# Patient Record
Sex: Male | Born: 1955
Health system: Southern US, Community
[De-identification: ages and names within clinical notes are randomized; demographics above are authoritative.]

## PROBLEM LIST (undated history)

## (undated) DIAGNOSIS — M25462 Effusion, left knee: Secondary | ICD-10-CM

## (undated) DIAGNOSIS — R7303 Prediabetes: Secondary | ICD-10-CM

## (undated) DIAGNOSIS — R1310 Dysphagia, unspecified: Secondary | ICD-10-CM

## (undated) DIAGNOSIS — J189 Pneumonia, unspecified organism: Secondary | ICD-10-CM

## (undated) DIAGNOSIS — D649 Anemia, unspecified: Secondary | ICD-10-CM

## (undated) DIAGNOSIS — J029 Acute pharyngitis, unspecified: Secondary | ICD-10-CM

## (undated) DIAGNOSIS — Z8719 Personal history of other diseases of the digestive system: Secondary | ICD-10-CM

## (undated) DIAGNOSIS — E669 Obesity, unspecified: Secondary | ICD-10-CM

## (undated) DIAGNOSIS — E291 Testicular hypofunction: Secondary | ICD-10-CM

## (undated) DIAGNOSIS — T4145XA Adverse effect of unspecified anesthetic, initial encounter: Secondary | ICD-10-CM

## (undated) DIAGNOSIS — E7800 Pure hypercholesterolemia, unspecified: Secondary | ICD-10-CM

## (undated) DIAGNOSIS — R509 Fever, unspecified: Secondary | ICD-10-CM

## (undated) DIAGNOSIS — N2889 Other specified disorders of kidney and ureter: Secondary | ICD-10-CM

## (undated) DIAGNOSIS — K8020 Calculus of gallbladder without cholecystitis without obstruction: Secondary | ICD-10-CM

## (undated) DIAGNOSIS — M1990 Unspecified osteoarthritis, unspecified site: Secondary | ICD-10-CM

## (undated) DIAGNOSIS — N529 Male erectile dysfunction, unspecified: Secondary | ICD-10-CM

## (undated) DIAGNOSIS — T8859XA Other complications of anesthesia, initial encounter: Secondary | ICD-10-CM

## (undated) DIAGNOSIS — Z8673 Personal history of transient ischemic attack (TIA), and cerebral infarction without residual deficits: Secondary | ICD-10-CM

## (undated) DIAGNOSIS — Z8709 Personal history of other diseases of the respiratory system: Secondary | ICD-10-CM

## (undated) DIAGNOSIS — K219 Gastro-esophageal reflux disease without esophagitis: Secondary | ICD-10-CM

## (undated) DIAGNOSIS — E119 Type 2 diabetes mellitus without complications: Secondary | ICD-10-CM

## (undated) DIAGNOSIS — R112 Nausea with vomiting, unspecified: Secondary | ICD-10-CM

## (undated) DIAGNOSIS — G2581 Restless legs syndrome: Secondary | ICD-10-CM

## (undated) DIAGNOSIS — Z9889 Other specified postprocedural states: Secondary | ICD-10-CM

## (undated) HISTORY — PX: OTHER SURGICAL HISTORY: SHX169

## (undated) HISTORY — PX: KNEE ARTHROSCOPY: SUR90

## (undated) HISTORY — PX: NASAL SEPTUM SURGERY: SHX37

---

## 1999-03-24 ENCOUNTER — Encounter: Payer: Self-pay | Admitting: Internal Medicine

## 1999-03-24 ENCOUNTER — Encounter: Admission: RE | Admit: 1999-03-24 | Discharge: 1999-03-24 | Payer: Self-pay | Admitting: Internal Medicine

## 2001-04-14 ENCOUNTER — Encounter: Admission: RE | Admit: 2001-04-14 | Discharge: 2001-06-02 | Payer: Self-pay | Admitting: Internal Medicine

## 2001-04-22 ENCOUNTER — Ambulatory Visit (HOSPITAL_COMMUNITY): Admission: RE | Admit: 2001-04-22 | Discharge: 2001-04-22 | Payer: Self-pay | Admitting: Specialist

## 2001-04-22 ENCOUNTER — Encounter: Payer: Self-pay | Admitting: Specialist

## 2001-05-20 ENCOUNTER — Ambulatory Visit (HOSPITAL_COMMUNITY): Admission: RE | Admit: 2001-05-20 | Discharge: 2001-05-20 | Payer: Self-pay | Admitting: *Deleted

## 2002-04-23 ENCOUNTER — Encounter: Payer: Self-pay | Admitting: Endocrinology

## 2002-04-23 ENCOUNTER — Ambulatory Visit (HOSPITAL_COMMUNITY): Admission: RE | Admit: 2002-04-23 | Discharge: 2002-04-23 | Payer: Self-pay | Admitting: Endocrinology

## 2005-11-24 ENCOUNTER — Ambulatory Visit (HOSPITAL_COMMUNITY): Admission: RE | Admit: 2005-11-24 | Discharge: 2005-11-24 | Payer: Self-pay | Admitting: *Deleted

## 2012-12-19 ENCOUNTER — Ambulatory Visit: Payer: 59 | Admitting: Emergency Medicine

## 2012-12-19 VITALS — BP 160/90 | HR 86 | Temp 99.3°F | Resp 16 | Ht 68.0 in | Wt 212.0 lb

## 2012-12-19 DIAGNOSIS — J018 Other acute sinusitis: Secondary | ICD-10-CM

## 2012-12-19 MED ORDER — AMOXICILLIN-POT CLAVULANATE 875-125 MG PO TABS: 1.0000 | ORAL_TABLET | Freq: Two times a day (BID) | ORAL | Status: DC

## 2012-12-19 NOTE — Patient Instructions (Addendum)
Hypertension As your heart beats, it forces blood through your arteries. This force is your blood pressure. If the pressure is too high, it is called hypertension (HTN) or high blood pressure. HTN is dangerous because you may have it and not know it. High blood pressure may mean that your heart has to work harder to pump blood. Your arteries may be narrow or stiff. The extra work puts you at risk for heart disease, stroke, and other problems.  Blood pressure consists of two numbers, a higher number over a lower, 110/72, for example. It is stated as "110 over 72." The ideal is below 120 for the top number (systolic) and under 80 for the bottom (diastolic). Write down your blood pressure today. You should pay close attention to your blood pressure if you have certain conditions such as:  Heart failure.  Prior heart attack.  Diabetes  Chronic kidney disease.  Prior stroke.  Multiple risk factors for heart disease. To see if you have HTN, your blood pressure should be measured while you are seated with your arm held at the level of the heart. It should be measured at least twice. A one-time elevated blood pressure reading (especially in the Emergency Department) does not mean that you need treatment. There may be conditions in which the blood pressure is different between your right and left arms. It is important to see your caregiver soon for a recheck. Most people have essential hypertension which means that there is not a specific cause. This type of high blood pressure may be lowered by changing lifestyle factors such as:  Stress.  Smoking.  Lack of exercise.  Excessive weight.  Drug/tobacco/alcohol use.  Eating less salt. Most people do not have symptoms from high blood pressure until it has caused damage to the body. Effective treatment can often prevent, delay or reduce that damage. TREATMENT  When a cause has been identified, treatment for high blood pressure is directed at the  cause. There are a large number of medications to treat HTN. These fall into several categories, and your caregiver will help you select the medicines that are best for you. Medications may have side effects. You should review side effects with your caregiver. If your blood pressure stays high after you have made lifestyle changes or started on medicines,   Your medication(s) may need to be changed.  Other problems may need to be addressed.  Be certain you understand your prescriptions, and know how and when to take your medicine.  Be sure to follow up with your caregiver within the time frame advised (usually within two weeks) to have your blood pressure rechecked and to review your medications.  If you are taking more than one medicine to lower your blood pressure, make sure you know how and at what times they should be taken. Taking two medicines at the same time can result in blood pressure that is too low. SEEK IMMEDIATE MEDICAL CARE IF:  You develop a severe headache, blurred or changing vision, or confusion.  You have unusual weakness or numbness, or a faint feeling.  You have severe chest or abdominal pain, vomiting, or breathing problems. MAKE SURE YOU:   Understand these instructions.  Will watch your condition.  Will get help right away if you are not doing well or get worse. Document Released: 01/12/2005 Document Revised: 04/06/2011 Document Reviewed: 09/02/2007 ExitCare Patient Information 2014 [employer]. Sinusitis Sinusitis is redness, soreness, and swelling (inflammation) of the paranasal sinuses. Paranasal sinuses are air   pockets within the bones of your face (beneath the eyes, the middle of the forehead, or above the eyes). In healthy paranasal sinuses, mucus is able to drain out, and air is able to circulate through them by way of your nose. However, when your paranasal sinuses are inflamed, mucus and air can become trapped. This can allow bacteria and other germs  to grow and cause infection. Sinusitis can develop quickly and last only a short time (acute) or continue over a long period (chronic). Sinusitis that lasts for more than 12 weeks is considered chronic.  CAUSES  Causes of sinusitis include:  Allergies.  Structural abnormalities, such as displacement of the cartilage that separates your nostrils (deviated septum), which can decrease the air flow through your nose and sinuses and affect sinus drainage.  Functional abnormalities, such as when the small hairs (cilia) that line your sinuses and help remove mucus do not work properly or are not present. SYMPTOMS  Symptoms of acute and chronic sinusitis are the same. The primary symptoms are pain and pressure around the affected sinuses. Other symptoms include:  Upper toothache.  Earache.  Headache.  Bad breath.  Decreased sense of smell and taste.  A cough, which worsens when you are lying flat.  Fatigue.  Fever.  Thick drainage from your nose, which often is green and may contain pus (purulent).  Swelling and warmth over the affected sinuses. DIAGNOSIS  Your caregiver will perform a physical exam. During the exam, your caregiver may:  Look in your nose for signs of abnormal growths in your nostrils (nasal polyps).  Tap over the affected sinus to check for signs of infection.  View the inside of your sinuses (endoscopy) with a special imaging device with a light attached (endoscope), which is inserted into your sinuses. If your caregiver suspects that you have chronic sinusitis, one or more of the following tests may be recommended:  Allergy tests.  Nasal culture A sample of mucus is taken from your nose and sent to a lab and screened for bacteria.  Nasal cytology A sample of mucus is taken from your nose and examined by your caregiver to determine if your sinusitis is related to an allergy. TREATMENT  Most cases of acute sinusitis are related to a viral infection and will  resolve on their own within 10 days. Sometimes medicines are prescribed to help relieve symptoms (pain medicine, decongestants, nasal steroid sprays, or saline sprays).  However, for sinusitis related to a bacterial infection, your caregiver will prescribe antibiotic medicines. These are medicines that will help kill the bacteria causing the infection.  Rarely, sinusitis is caused by a fungal infection. In theses cases, your caregiver will prescribe antifungal medicine. For some cases of chronic sinusitis, surgery is needed. Generally, these are cases in which sinusitis recurs more than 3 times per year, despite other treatments. HOME CARE INSTRUCTIONS   Drink plenty of water. Water helps thin the mucus so your sinuses can drain more easily.  Use a humidifier.  Inhale steam 3 to 4 times a day (for example, sit in the bathroom with the shower running).  Apply a warm, moist washcloth to your face 3 to 4 times a day, or as directed by your caregiver.  Use saline nasal sprays to help moisten and clean your sinuses.  Take over-the-counter or prescription medicines for pain, discomfort, or fever only as directed by your caregiver. SEEK IMMEDIATE MEDICAL CARE IF:  You have increasing pain or severe headaches.  You have nausea, vomiting,   or drowsiness.  You have swelling around your face.  You have vision problems.  You have a stiff neck.  You have difficulty breathing. MAKE SURE YOU:   Understand these instructions.  Will watch your condition.  Will get help right away if you are not doing well or get worse. Document Released: 01/12/2005 Document Revised: 04/06/2011 Document Reviewed: 01/27/2011 ExitCare Patient Information 2014 [employer].  

## 2012-12-19 NOTE — Progress Notes (Signed)
[facility] [address] [place] [address] [phone]  Date:  12/19/2012   Name:  Lance Scott   DOB:  1955-12-29   MRN:  [id number]  PCP:  No primary provider on file.    Chief Complaint: Sore Throat   History of Present Illness:  Lance Scott is a 57 y.o. very pleasant male patient who presents with the following:  Ill with nasal congestion and post nasal drip.  Green discharge.  No fever or chills.  Occasional non productive cough. No wheezing or shortness of breath.  No nausea or vomiting. No stool change or rash  Some sore throat.  No improvement with over the counter medications or other home remedies. Denies other complaint or health concern today.   There are no active problems to display for this patient.   No past medical history on file.  No past surgical history on file.  History  Substance Use Topics  . Smoking status: Never Smoker   . Smokeless tobacco: Not on file  . Alcohol Use: Not on file    No family history on file.  No Known Allergies  Medication list has been reviewed and updated.  No current outpatient prescriptions on file prior to visit.   No current facility-administered medications on file prior to visit.    Review of Systems:  As per HPI, otherwise negative.    Physical Examination: Filed Vitals:   12/19/12 1504  BP: 160/90  Pulse: 86  Temp: 99.3 F (37.4 C)  Resp: 16   Filed Vitals:   12/19/12 1504  Height: 5\' 8"  (1.727 m)  Weight: 212 lb (96.163 kg)   Body mass index is 32.24 kg/(m^2). Ideal Body Weight: Weight in (lb) to have BMI = 25: 164.1  GEN: WDWN, NAD, Non-toxic, A & O x 3 HEENT: Atraumatic, Normocephalic. Neck supple. No masses, No LAD. Ears and Nose: No external deformity. CV: RRR, No M/G/R. No JVD. No thrill. No extra heart sounds. PULM: CTA B, no wheezes, crackles, rhonchi. No retractions. No resp. distress. No accessory muscle use. ABD: S, NT, ND, +BS. No rebound. No  HSM. EXTR: No c/c/e NEURO Normal gait.  PSYCH: Normally interactive. Conversant. Not depressed or anxious appearing.  Calm demeanor.    Assessment and Plan: Sinusitis augmentin Elevated BP Recheck BP    Signed,  Phillips Odor, MD

## 2013-05-10 ENCOUNTER — Ambulatory Visit: Payer: Self-pay | Admitting: Family Medicine

## 2013-05-10 VITALS — BP 146/90 | HR 55 | Temp 98.1°F | Resp 16 | Ht 68.0 in | Wt 216.0 lb

## 2013-05-10 DIAGNOSIS — Z0289 Encounter for other administrative examinations: Secondary | ICD-10-CM

## 2013-05-10 DIAGNOSIS — Z0000 Encounter for general adult medical examination without abnormal findings: Secondary | ICD-10-CM

## 2013-05-10 NOTE — Progress Notes (Signed)
[facility] [address] [place] [address] [phone]  Date:  05/10/2013   Name:  Lance Scott   DOB:  [birth year]   MRN:  [id number]  PCP:  No primary provider on file.    Chief Complaint: Employment Physical   History of Present Illness:  Lance Scott is a 58 y.o. very pleasant male patient who presents with the following:  He is here today for a DOT exam.  No prior history of HTN He is generally very healthy.   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   No past medical history on file.  No past surgical history on file.  History  Substance Use Topics  . Smoking status: Never Smoker   . Smokeless tobacco: Not on file  . Alcohol Use: Not on file    No family history on file.  No Known Allergies  Medication list has been reviewed and updated.  Current Outpatient Prescriptions on File Prior to Visit  Medication Sig Dispense Refill  . pantoprazole (PROTONIX) 20 MG tablet Take 20 mg by mouth daily.      Marland Kitchen amoxicillin-clavulanate (AUGMENTIN) 875-125 MG per tablet Take 1 tablet by mouth 2 (two) times daily.  20 tablet  0   No current facility-administered medications on file prior to visit.    Review of Systems:  As per HPI- otherwise negative.   Physical Examination: Filed Vitals:   05/10/13 1222  BP: 168/82  Pulse: 55  Temp: 98.1 F (36.7 C)  Resp: 16   Filed Vitals:   05/10/13 1222  Height: 5\' 8"  (1.727 m)  Weight: 216 lb (97.977 kg)   Body mass index is 32.85 kg/(m^2). Ideal Body Weight: Weight in (lb) to have BMI = 25: 164.1  GEN: WDWN, NAD, Non-toxic, A & O x 3, looks well HEENT: Atraumatic, Normocephalic. Neck supple. No masses, No LAD.  Bilateral TM wnl, oropharynx normal.  PEERL,EOMI.   Ears and Nose: No external deformity. CV: RRR, No M/G/R. No JVD. No thrill. No extra heart sounds. PULM: CTA B, no wheezes, crackles, rhonchi. No  retractions. No resp. distress. No accessory muscle use. ABD: S, NT, ND. No rebound. No HSM. EXTR: No c/c/e NEURO Normal gait. Normal strength, sensation and DTR all extremities PSYCH: Normally interactive. Conversant. Not depressed or anxious appearing.  Calm demeanor.  Gu: no inguinal hernia . 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

## 2014-08-16 ENCOUNTER — Ambulatory Visit: Payer: Self-pay | Admitting: Orthopedic Surgery

## 2014-08-16 NOTE — Progress Notes (Signed)
Preoperative surgical orders have been place into the Epic hospital system for Lance Scott on 08/16/2014, 6:24 PM  by Mickel Crow for surgery on 09/10/14.  Preop Total Knee orders including Experal, IV Tylenol, and IV Decadron as long as there are no contraindications to the above medications. [name], PA-C

## 2014-09-03 ENCOUNTER — Encounter (HOSPITAL_COMMUNITY): Payer: Self-pay

## 2014-09-03 ENCOUNTER — Other Ambulatory Visit (HOSPITAL_COMMUNITY): Payer: Self-pay | Admitting: *Deleted

## 2014-09-03 ENCOUNTER — Encounter (HOSPITAL_COMMUNITY)
Admission: RE | Admit: 2014-09-03 | Discharge: 2014-09-03 | Disposition: A | Discharge status: Home / Self Care | Payer: 59 | Source: Ambulatory Visit | Attending: Orthopedic Surgery | Admitting: Orthopedic Surgery

## 2014-09-03 DIAGNOSIS — M179 Osteoarthritis of knee, unspecified: Secondary | ICD-10-CM | POA: Diagnosis not present

## 2014-09-03 DIAGNOSIS — Z01818 Encounter for other preprocedural examination: Secondary | ICD-10-CM | POA: Insufficient documentation

## 2014-09-03 HISTORY — DX: Other complications of anesthesia, initial encounter: T88.59XA

## 2014-09-03 HISTORY — DX: Nausea with vomiting, unspecified: R11.2

## 2014-09-03 HISTORY — DX: Adverse effect of unspecified anesthetic, initial encounter: T41.45XA

## 2014-09-03 HISTORY — DX: Gastro-esophageal reflux disease without esophagitis: K21.9

## 2014-09-03 HISTORY — DX: Unspecified osteoarthritis, unspecified site: M19.90

## 2014-09-03 HISTORY — DX: Other specified postprocedural states: Z98.890

## 2014-09-03 LAB — COMPREHENSIVE METABOLIC PANEL
ALBUMIN: 4.2 g/dL (ref 3.5–5.0)
ALK PHOS: 62 U/L (ref 38–126)
ALT: 14 U/L — ABNORMAL LOW (ref 17–63)
AST: 18 U/L (ref 15–41)
Anion gap: 6 (ref 5–15)
BUN: 12 mg/dL (ref 6–20)
CALCIUM: 9.3 mg/dL (ref 8.9–10.3)
CO2: 29 mmol/L (ref 22–32)
Chloride: 103 mmol/L (ref 101–111)
Creatinine, Ser: 1.07 mg/dL (ref 0.61–1.24)
GFR calc Af Amer: >60 mL/min (ref >60)
GFR calc non Af Amer: >60 mL/min (ref >60)
Glucose, Bld: 102 mg/dL — ABNORMAL HIGH (ref 65–99)
POTASSIUM: 4.5 mmol/L (ref 3.5–5.1)
Sodium: 138 mmol/L (ref 135–145)
Total Bilirubin: 1.1 mg/dL (ref 0.3–1.2)
Total Protein: 7.2 g/dL (ref 6.5–8.1)

## 2014-09-03 LAB — URINALYSIS, ROUTINE W REFLEX MICROSCOPIC
Bilirubin Urine: NEGATIVE
Glucose, UA: NEGATIVE mg/dL
Hgb urine dipstick: NEGATIVE
Ketones, ur: NEGATIVE mg/dL
Leukocytes, UA: NEGATIVE
Nitrite: NEGATIVE
Protein, ur: NEGATIVE mg/dL
Specific Gravity, Urine: 1.014 (ref 1.005–1.030)
Urobilinogen, UA: 0.2 mg/dL (ref 0.0–1.0)
pH: 6.5 (ref 5.0–8.0)

## 2014-09-03 LAB — CBC
HCT: 41.2 % (ref 39.0–52.0)
Hemoglobin: 13.4 g/dL (ref 13.0–17.0)
MCH: 27.6 pg (ref 26.0–34.0)
MCHC: 32.5 g/dL (ref 30.0–36.0)
MCV: 84.9 fL (ref 78.0–100.0)
PLATELETS: 302 10*3/uL (ref 150–400)
RBC: 4.85 MIL/uL (ref 4.22–5.81)
RDW: 13.7 % (ref 11.5–15.5)
WBC: 7.2 10*3/uL (ref 4.0–10.5)

## 2014-09-03 LAB — SURGICAL PCR SCREEN
MRSA, PCR: NEGATIVE
Staphylococcus aureus: POSITIVE — AB

## 2014-09-03 LAB — PROTIME-INR
INR: 0.99 (ref 0.00–1.49)
Prothrombin Time: 13.3 seconds (ref 11.6–15.2)

## 2014-09-03 LAB — ABO/RH: ABO/RH(D): O POS

## 2014-09-03 LAB — APTT: APTT: 35 s (ref 24–37)

## 2014-09-03 NOTE — Patient Instructions (Addendum)
YOUR PROCEDURE IS SCHEDULED ON :  09/10/14  REPORT TO [facility] ENTRANCE FOLLOW SIGNS TO EAST ELEVATOR - GO TO 3rd FLOOR CHECK IN AT 3 EAST NURSES STATION (SHORT STAY) AT:  5:15 AM  CALL THIS NUMBER IF YOU HAVE PROBLEMS THE MORNING OF SURGERY [phone]  REMEMBER:ONLY 1 PER PERSON MAY GO TO SHORT STAY WITH YOU TO GET READY THE MORNING OF YOUR SURGERY  DO NOT EAT FOOD OR DRINK LIQUIDS AFTER MIDNIGHT  TAKE THESE MEDICINES THE MORNING OF SURGERY:NONE  YOU MAY NOT HAVE ANY METAL ON YOUR BODY INCLUDING HAIR PINS AND PIERCING'S. DO NOT WEAR JEWELRY, MAKEUP, LOTIONS, POWDERS OR PERFUMES. DO NOT WEAR NAIL POLISH. DO NOT SHAVE 48 HRS PRIOR TO SURGERY. MEN MAY SHAVE FACE AND NECK.  DO NOT [place]. [facility] IS NOT RESPONSIBLE FOR VALUABLES.  CONTACTS, DENTURES OR PARTIALS MAY NOT BE WORN TO SURGERY. LEAVE SUITCASE IN CAR. CAN BE BROUGHT TO ROOM AFTER SURGERY.  PATIENTS DISCHARGED THE DAY OF SURGERY WILL NOT BE ALLOWED TO DRIVE HOME.  PLEASE READ OVER THE FOLLOWING INSTRUCTION SHEETS _________________________________________________________________________________                                          [facility] - PREPARING FOR SURGERY  Before surgery, you can play an important role.  Because skin is not sterile, your skin needs to be as free of germs as possible.  You can reduce the number of germs on your skin by washing with CHG (chlorahexidine gluconate) soap before surgery.  CHG is an antiseptic cleaner which kills germs and bonds with the skin to continue killing germs even after washing. Please DO NOT use if you have an allergy to CHG or antibacterial soaps.  If your skin becomes reddened/irritated stop using the CHG and inform your nurse when you arrive at Short Stay. Do not shave (including legs and underarms) for at least 48 hours prior to the first CHG shower.  You may shave your face. Please follow these instructions  carefully:   1.  Shower with CHG Soap the night before surgery and the  morning of Surgery.   2.  If you choose to wash your hair, wash your hair first as usual with your  normal  Shampoo.   3.  After you shampoo, rinse your hair and body thoroughly to remove the  shampoo.                                         4.  Use CHG as you would any other liquid soap.  You can apply chg directly  to the skin and wash . Gently wash with scrungie or clean wascloth    5.  Apply the CHG Soap to your body ONLY FROM THE NECK DOWN.   Do not use on open                           Wound or open sores. Avoid contact with eyes, ears mouth and genitals (private parts).                        Genitals (private parts) with your normal soap.  6.  Wash thoroughly, paying special attention to the area where your surgery  will be performed.   7.  Thoroughly rinse your body with warm water from the neck down.   8.  DO NOT shower/wash with your normal soap after using and rinsing off  the CHG Soap .                9.  Pat yourself dry with a clean towel.             10.  Wear clean night clothes to bed after shower             11.  Place clean sheets on your bed the night of your first shower and do not  sleep with pets.  Day of Surgery : Do not apply any lotions/deodorants the morning of surgery.  Please wear clean clothes to the hospital/surgery center.  FAILURE TO FOLLOW THESE INSTRUCTIONS MAY RESULT IN THE CANCELLATION OF YOUR SURGERY    PATIENT SIGNATURE_________________________________  ______________________________________________________________________     Adam Phenix  An incentive spirometer is a tool that can help keep your lungs clear and active. This tool measures how well you are filling your lungs with each breath. Taking long deep breaths may help reverse or decrease the chance of developing breathing (pulmonary) problems (especially infection) following:  A long  period of time when you are unable to move or be active. BEFORE THE PROCEDURE   If the spirometer includes an indicator to show your best effort, your nurse or respiratory therapist will set it to a desired goal.  If possible, sit up straight or lean slightly forward. Try not to slouch.  Hold the incentive spirometer in an upright position. INSTRUCTIONS FOR USE   Sit on the edge of your bed if possible, or sit up as far as you can in bed or on a chair.  Hold the incentive spirometer in an upright position.  Breathe out normally.  Place the mouthpiece in your mouth and seal your lips tightly around it.  Breathe in slowly and as deeply as possible, raising the piston or the ball toward the top of the column.  Hold your breath for 3-5 seconds or for as long as possible. Allow the piston or ball to fall to the bottom of the column.  Remove the mouthpiece from your mouth and breathe out normally.  Rest for a few seconds and repeat Steps 1 through 7 at least 10 times every 1-2 hours when you are awake. Take your time and take a few normal breaths between deep breaths.  The spirometer may include an indicator to show your best effort. Use the indicator as a goal to work toward during each repetition.  After each set of 10 deep breaths, practice coughing to be sure your lungs are clear. If you have an incision (the cut made at the time of surgery), support your incision when coughing by placing a pillow or rolled up towels firmly against it. Once you are able to get out of bed, walk around indoors and cough well. You may stop using the incentive spirometer when instructed by your caregiver.  RISKS AND COMPLICATIONS  Take your time so you do not get dizzy or light-headed.  If you are in pain, you may need to take or ask for pain medication before doing incentive spirometry. It is harder to take a deep breath if you are having pain. AFTER USE  Rest and breathe slowly and easily.  It can  be helpful to keep track of a log of your progress. Your caregiver can provide you with a simple table to help with this. If you are using the spirometer at home, follow these instructions: [place] IF:   You are having difficultly using the spirometer.  You have trouble using the spirometer as often as instructed.  Your pain medication is not giving enough relief while using the spirometer.  You develop fever of 100.5 F (38.1 C) or higher. SEEK IMMEDIATE MEDICAL CARE IF:   You cough up bloody sputum that had not been present before.  You develop fever of 102 F (38.9 C) or greater.  You develop worsening pain at or near the incision site. MAKE SURE YOU:   Understand these instructions.  Will watch your condition.  Will get help right away if you are not doing well or get worse. Document Released: 05/25/2006 Document Revised: 04/06/2011 Document Reviewed: 07/26/2006 ExitCare Patient Information 2014 [employer], [place].   ________________________________________________________________________  WHAT IS A BLOOD TRANSFUSION? Blood Transfusion Information  A transfusion is the replacement of blood or some of its parts. Blood is made up of multiple cells which provide different functions.  Red blood cells carry oxygen and are used for blood loss replacement.  White blood cells fight against infection.  Platelets control bleeding.  Plasma helps clot blood.  Other blood products are available for specialized needs, such as hemophilia or other clotting disorders. BEFORE THE TRANSFUSION  Who gives blood for transfusions?   Healthy volunteers who are fully evaluated to make sure their blood is safe. This is blood bank blood. Transfusion therapy is the safest it has ever been in the practice of medicine. Before blood is taken from a donor, a complete history is taken to make sure that person has no history of diseases nor engages in risky social behavior (examples are  intravenous drug use or sexual activity with multiple partners). The donor's travel history is screened to minimize risk of transmitting infections, such as malaria. The donated blood is tested for signs of infectious diseases, such as HIV and hepatitis. The blood is then tested to be sure it is compatible with you in order to minimize the chance of a transfusion reaction. If you or a relative donates blood, this is often done in anticipation of surgery and is not appropriate for emergency situations. It takes many days to process the donated blood. RISKS AND COMPLICATIONS Although transfusion therapy is very safe and saves many lives, the main dangers of transfusion include:   Getting an infectious disease.  Developing a transfusion reaction. This is an allergic reaction to something in the blood you were given. Every precaution is taken to prevent this. The decision to have a blood transfusion has been considered carefully by your caregiver before blood is given. Blood is not given unless the benefits outweigh the risks. AFTER THE TRANSFUSION  Right after receiving a blood transfusion, you will usually feel much better and more energetic. This is especially true if your red blood cells have gotten low (anemic). The transfusion raises the level of the red blood cells which carry oxygen, and this usually causes an energy increase.  The nurse administering the transfusion will monitor you carefully for complications. HOME CARE INSTRUCTIONS  No special instructions are needed after a transfusion. You may find your energy is better. Speak with your caregiver about any limitations on activity for underlying diseases you may have. SEEK MEDICAL CARE IF:   Your  condition is not improving after your transfusion.  You develop redness or irritation at the intravenous (IV) site. SEEK IMMEDIATE MEDICAL CARE IF:  Any of the following symptoms occur over the next 12 hours:  Shaking chills.  You have a  temperature by mouth above 102 F (38.9 C), not controlled by medicine.  Chest, back, or muscle pain.  People around you feel you are not acting correctly or are confused.  Shortness of breath or difficulty breathing.  Dizziness and fainting.  You get a rash or develop hives.  You have a decrease in urine output.  Your urine turns a dark color or changes to pink, red, or brown. Any of the following symptoms occur over the next 10 days:  You have a temperature by mouth above 102 F (38.9 C), not controlled by medicine.  Shortness of breath.  Weakness after normal activity.  The white part of the eye turns yellow (jaundice).  You have a decrease in the amount of urine or are urinating less often.  Your urine turns a dark color or changes to pink, red, or brown. Document Released: 01/10/2000 Document Revised: 04/06/2011 Document Reviewed: 08/29/2007 [facility] Patient Information 2014 [place], [place].  _______________________________________________________________________

## 2014-09-04 NOTE — Progress Notes (Signed)
Rx Mupuricin called to [facility] in [place] notified

## 2014-09-09 ENCOUNTER — Ambulatory Visit: Payer: Self-pay | Admitting: Orthopedic Surgery

## 2014-09-09 NOTE — H&P (Signed)
Lance Scott DOB: 1955/10/25 Married / Language: Cleophus Molt / Race: White Male Date of Admission:  09/10/2014 CC:  Left Knee Pain History of Present Illness The patient is a 59 year old male who comes in  for a preoperative History and Physical. The patient is scheduled for a left total knee arthroplasty to be performed by Dr. Dione Plover. Aluisio, MD at [facility] on 09-10-2014. The patient reports left knee symptoms including: pain, swelling, instability and weakness. The patient describes the severity of the symptoms as moderate in severity.The patient feels that the symptoms are improving (but he is off of it more). Risk factors include arthroscopic surgery (ACL reconstruction in 2003 by Dr Theda Sers). He states that the left knee has gotten progressively worse over time. In the past few months he has had a marked increase in pain. He had an ACL reconstruction about 13 years ago with Dr. Theda Sers. He has always had some discomfort, even after reconstruction, but it has gotten worse recently. The knee is not giving out on him anymore. He said he has had a knee injury since teenage years. The knee is now limiting what he can and cannot do. It does hurt with activity, occasionally hurting at rest. He has had injections in the past with slight benefit. AP of both knees and lateral show bone-on-bone arthritis in the medial and patellofemoral compartments of the left knee with a large osteophyte formation. He has evidence of an old ACL reconstruction. At this point, the most predictable means of improving pain and function is total knee arthroplasty. AP of both knees and lateral show bone-on-bone arthritis in the medial and patellofemoral compartments of the left knee with a large osteophyte formation. He has evidence of an old ACL reconstruction. He really does not want injections because he does not want a temporary fix. He wants to be able to get around and do what he desires, and the knee is currently  preventing him from doing so. He does not see any mechanism as to how injections would help get his function back. I agree that the injections may help some of the discomfort but not the function. At this point the most predictable means of improving his pain and function would be total knee arthroplasty. He wants to go ahead and proceed. They have been treated conservatively in the past for the above stated problem and despite conservative measures, they continue to have progressive pain and severe functional limitations and dysfunction. They have failed non-operative management including home exercise, medications, and injections. It is felt that they would benefit from undergoing total joint replacement. Risks and benefits of the procedure have been discussed with the patient and they elect to proceed with surgery. There are no active contraindications to surgery such as ongoing infection or rapidly progressive neurological disease.  Problem List/Past Medical Plantar fasciitis (M72.2) Post-traumatic osteoarthritis of left knee (M17.32) Neck sprain and strain (S13.9XXA)04/14/1999 Gastroesophageal Reflux Disease Sprain/strain, knee, cruciate ligament (844.2)11/07/2001 Hypercholesterolemia History of Lacunar CVS History of Esophageal Stricture  Allergies No Known Drug Allergies  Family History Hypertension mother Diabetes Mellitus father and brother Heart Disease father, brother and grandfather fathers side Cancer father Father Deceased. age 13 Mother Living. age 36  Social History Tobacco use never smoker Number of flights of stairs before winded 4-5 Pain Contract no Tobacco / smoke exposure no Alcohol use current drinker; drinks wine; only occasionally per week Children 3 Copy of Drug/Alcohol Rehab (Previously) no Illicit drug use no Living situation live  with spouse Marital status married Current work status working full time Drug/Alcohol Rehab  (Currently) no Exercise Exercises weekly; does running / walking  Medication History  Aspirin EC (Oral) Specific dose unknown - Active. Lipitor (Oral) Specific dose unknown - Active. Pantoprazole Sodium (40MG  Tablet DR, Oral) Active. Multivitamin Active.  Past Surgical History Arthroscopy of Knee left Colon Polyp Removal - Colonoscopy Sinus Surgery Straighten Nasal Septum Left Knee Surgery Date: 1991.   Review of Systems General Not Present- Chills, Fatigue, Fever, Memory Loss, Night Sweats, Weight Gain and Weight Loss. Skin Not Present- Eczema, Hives, Itching, Lesions and Rash. HEENT Not Present- Dentures, Double Vision, Headache, Hearing Loss, Tinnitus and Visual Loss. Respiratory Not Present- Allergies, Chronic Cough, Coughing up blood, Shortness of breath at rest and Shortness of breath with exertion. Cardiovascular Not Present- Chest Pain, Difficulty Breathing Lying Down, Murmur, Palpitations, Racing/skipping heartbeats and Swelling. Gastrointestinal Not Present- Abdominal Pain, Bloody Stool, Constipation, Diarrhea, Difficulty Swallowing, Heartburn, Jaundice, Loss of appetitie, Nausea and Vomiting. Male Genitourinary Not Present- Blood in Urine, Discharge, Flank Pain, Incontinence, Painful Urination, Urgency, Urinary frequency, Urinary Retention, Urinating at Night and Weak urinary stream. Musculoskeletal Present- Joint Pain. Not Present- Back Pain, Joint Swelling, Morning Stiffness, Muscle Pain, Muscle Weakness and Spasms. Neurological Not Present- Blackout spells, Difficulty with balance, Dizziness, Paralysis, Tremor and Weakness. Psychiatric Not Present- Insomnia.  Vitals  Weight: 210 lb Height: 67.5in Weight was reported by patient. Height was reported by patient. Body Surface Area: 2.08 m Body Mass Index: 32.4 kg/m  BP: 178/96 (Sitting, Right Arm, Standard)   Physical Exam General Mental Status -Alert, cooperative and good historian. General  Appearance-pleasant, Not in acute distress. Orientation-Oriented X3. Build & Nutrition-Well nourished and Well developed.  Head and Neck Head-normocephalic, atraumatic . Neck Global Assessment - supple, no bruit auscultated on the right, no bruit auscultated on the left.  Eye Pupil - Bilateral-Regular and Round. Motion - Bilateral-EOMI.  Chest and Lung Exam Auscultation Breath sounds - clear at anterior chest wall and clear at posterior chest wall. Adventitious sounds - No Adventitious sounds.  Cardiovascular Auscultation Rhythm - Regular rate and rhythm. Heart Sounds - S1 WNL and S2 WNL. Murmurs & Other Heart Sounds - Auscultation of the heart reveals - No Murmurs.  Abdomen Palpation/Percussion Tenderness - Abdomen is non-tender to palpation. Rigidity (guarding) - Abdomen is soft. Auscultation Auscultation of the abdomen reveals - Bowel sounds normal.  Male Genitourinary Note: Not done, not pertinent to present illness   Musculoskeletal Note: On exam he is alert and oriented, in no apparent distress. Evaluation of his hips shows normal range of motion with no discomfort. His right knee shows no effusion, range 0-135 with no tenderness or instability. Left knee with no effusion, slight varus, range 5-125, marked crepitus on range of motion; tenderness, medial greater than lateral, with no instability noted. Pulses, sensation, and motor are intact.  RADIOGRAPHS: AP of both knees and lateral show bone-on-bone arthritis in the medial and patellofemoral compartments of the left knee with a large osteophyte formation. He has evidence of an old ACL reconstruction.   Assessment & Plan Post-traumatic osteoarthritis of left knee (M17.32) Note:Surgical Plans: Left Total Knee Replacement  Disposition: Home  PCP: Dr. Shelia Media - Patient has been seen preoperatively and felt to be stable for surgery.  Topical TXA - History of Lacunar CVA  Anesthesia Issues: Nauseated  with anesthesia  Signed electronically by Joelene Millin, III PA-C

## 2014-09-10 ENCOUNTER — Inpatient Hospital Stay (HOSPITAL_COMMUNITY): Payer: 59 | Admitting: Anesthesiology

## 2014-09-10 ENCOUNTER — Encounter (HOSPITAL_COMMUNITY): Payer: Self-pay | Admitting: *Deleted

## 2014-09-10 ENCOUNTER — Encounter (HOSPITAL_COMMUNITY)
Admission: RE | Disposition: A | Discharge status: Home Health | Payer: Self-pay | Source: Ambulatory Visit | Attending: Orthopedic Surgery

## 2014-09-10 ENCOUNTER — Inpatient Hospital Stay (HOSPITAL_COMMUNITY)
Admission: RE | Admit: 2014-09-10 | Discharge: 2014-09-11 | DRG: 470 | Disposition: A | Discharge status: Home Health | Payer: 59 | Source: Ambulatory Visit | Attending: Orthopedic Surgery | Admitting: Orthopedic Surgery

## 2014-09-10 DIAGNOSIS — M1732 Unilateral post-traumatic osteoarthritis, left knee: Secondary | ICD-10-CM | POA: Diagnosis present

## 2014-09-10 DIAGNOSIS — Z79899 Other long term (current) drug therapy: Secondary | ICD-10-CM | POA: Diagnosis not present

## 2014-09-10 DIAGNOSIS — M25562 Pain in left knee: Secondary | ICD-10-CM | POA: Diagnosis present

## 2014-09-10 DIAGNOSIS — E780 Pure hypercholesterolemia: Secondary | ICD-10-CM | POA: Diagnosis present

## 2014-09-10 DIAGNOSIS — Z7982 Long term (current) use of aspirin: Secondary | ICD-10-CM

## 2014-09-10 DIAGNOSIS — K219 Gastro-esophageal reflux disease without esophagitis: Secondary | ICD-10-CM | POA: Diagnosis present

## 2014-09-10 DIAGNOSIS — Z01812 Encounter for preprocedural laboratory examination: Secondary | ICD-10-CM

## 2014-09-10 DIAGNOSIS — M179 Osteoarthritis of knee, unspecified: Secondary | ICD-10-CM | POA: Diagnosis present

## 2014-09-10 DIAGNOSIS — M1712 Unilateral primary osteoarthritis, left knee: Secondary | ICD-10-CM

## 2014-09-10 DIAGNOSIS — M1710 Unilateral primary osteoarthritis, unspecified knee: Secondary | ICD-10-CM | POA: Diagnosis present

## 2014-09-10 HISTORY — PX: TOTAL KNEE ARTHROPLASTY: SHX125

## 2014-09-10 LAB — TYPE AND SCREEN
ABO/RH(D): O POS
Antibody Screen: NEGATIVE

## 2014-09-10 SURGERY — ARTHROPLASTY, KNEE, TOTAL
Anesthesia: Spinal | Site: Knee | Laterality: Left

## 2014-09-10 MED ORDER — MENTHOL 3 MG MT LOZG: 1.0000 | LOZENGE | OROMUCOSAL | Status: DC | PRN

## 2014-09-10 MED ORDER — ONDANSETRON HCL 4 MG PO TABS: 4.0000 mg | ORAL_TABLET | Freq: Four times a day (QID) | ORAL | Status: DC | PRN

## 2014-09-10 MED ORDER — MORPHINE SULFATE 2 MG/ML IJ SOLN: 1.0000 mg | INTRAMUSCULAR | Status: DC | PRN

## 2014-09-10 MED ORDER — BISACODYL 10 MG RE SUPP: 10.0000 mg | Freq: Every day | RECTAL | Status: DC | PRN

## 2014-09-10 MED ORDER — SODIUM CHLORIDE 0.9 % IV SOLN
INTRAVENOUS | Status: DC
Start: 2014-09-10 — End: 2014-09-10

## 2014-09-10 MED ORDER — FENTANYL CITRATE (PF) 100 MCG/2ML IJ SOLN: 25.0000 ug | INTRAMUSCULAR | Status: DC | PRN

## 2014-09-10 MED ORDER — HYDROMORPHONE HCL 1 MG/ML IJ SOLN
INTRAMUSCULAR | Status: AC
Start: 2014-09-10 — End: 2014-09-10
  Filled 2014-09-10: qty 1

## 2014-09-10 MED ORDER — PROPOFOL 10 MG/ML IV BOLUS
INTRAVENOUS | Status: AC
  Filled 2014-09-10: qty 20

## 2014-09-10 MED ORDER — FLEET ENEMA 7-19 GM/118ML RE ENEM: 1.0000 | ENEMA | Freq: Once | RECTAL | Status: DC | PRN

## 2014-09-10 MED ORDER — CEFAZOLIN SODIUM-DEXTROSE 2-3 GM-% IV SOLR
2.0000 g | Freq: Four times a day (QID) | INTRAVENOUS | Status: AC
  Administered 2014-09-10 (×2): 2 g via INTRAVENOUS
  Filled 2014-09-10 (×2): qty 50

## 2014-09-10 MED ORDER — ONDANSETRON HCL 4 MG/2ML IJ SOLN
INTRAMUSCULAR | Status: AC
  Filled 2014-09-10: qty 2

## 2014-09-10 MED ORDER — SODIUM CHLORIDE 0.9 % IJ SOLN
INTRAMUSCULAR | Status: AC
  Filled 2014-09-10: qty 50

## 2014-09-10 MED ORDER — ACETAMINOPHEN 10 MG/ML IV SOLN
1000.0000 mg | Freq: Once | INTRAVENOUS | Status: AC
  Administered 2014-09-10: 1000 mg via INTRAVENOUS

## 2014-09-10 MED ORDER — KETOROLAC TROMETHAMINE 15 MG/ML IJ SOLN
7.5000 mg | Freq: Four times a day (QID) | INTRAMUSCULAR | Status: AC | PRN
  Administered 2014-09-10: 7.5 mg via INTRAVENOUS
  Filled 2014-09-10 (×2): qty 1

## 2014-09-10 MED ORDER — DEXTROSE 5 % IV SOLN
500.0000 mg | Freq: Four times a day (QID) | INTRAVENOUS | Status: DC | PRN
  Administered 2014-09-10: 500 mg via INTRAVENOUS
  Filled 2014-09-10 (×2): qty 5

## 2014-09-10 MED ORDER — DEXAMETHASONE SODIUM PHOSPHATE 10 MG/ML IJ SOLN
10.0000 mg | Freq: Once | INTRAMUSCULAR | Status: AC
  Administered 2014-09-11: 10 mg via INTRAVENOUS
  Filled 2014-09-10 (×2): qty 1

## 2014-09-10 MED ORDER — BUPIVACAINE HCL (PF) 0.75 % IJ SOLN
INTRAMUSCULAR | Status: DC | PRN
  Administered 2014-09-10: 15 mg via INTRATHECAL

## 2014-09-10 MED ORDER — MIDAZOLAM HCL 5 MG/5ML IJ SOLN
INTRAMUSCULAR | Status: DC | PRN
  Administered 2014-09-10: 2 mg via INTRAVENOUS

## 2014-09-10 MED ORDER — RIVAROXABAN 10 MG PO TABS
10.0000 mg | ORAL_TABLET | Freq: Every day | ORAL | Status: DC
  Administered 2014-09-11: 10 mg via ORAL
  Filled 2014-09-10 (×2): qty 1

## 2014-09-10 MED ORDER — BUPIVACAINE LIPOSOME 1.3 % IJ SUSP
INTRAMUSCULAR | Status: DC | PRN
  Administered 2014-09-10: 20 mL

## 2014-09-10 MED ORDER — ONDANSETRON HCL 4 MG/2ML IJ SOLN: 4.0000 mg | Freq: Four times a day (QID) | INTRAMUSCULAR | Status: DC | PRN

## 2014-09-10 MED ORDER — SODIUM CHLORIDE 0.9 % IJ SOLN
INTRAMUSCULAR | Status: DC | PRN
  Administered 2014-09-10: 30 mL

## 2014-09-10 MED ORDER — LACTATED RINGERS IV SOLN
INTRAVENOUS | Status: DC | PRN
  Administered 2014-09-10 (×2): via INTRAVENOUS

## 2014-09-10 MED ORDER — PHENYLEPHRINE 40 MCG/ML (10ML) SYRINGE FOR IV PUSH (FOR BLOOD PRESSURE SUPPORT)
PREFILLED_SYRINGE | INTRAVENOUS | Status: AC
  Filled 2014-09-10: qty 10

## 2014-09-10 MED ORDER — ACETAMINOPHEN 325 MG PO TABS: 650.0000 mg | ORAL_TABLET | Freq: Four times a day (QID) | ORAL | Status: DC | PRN

## 2014-09-10 MED ORDER — METOCLOPRAMIDE HCL 10 MG PO TABS: 5.0000 mg | ORAL_TABLET | Freq: Three times a day (TID) | ORAL | Status: DC | PRN

## 2014-09-10 MED ORDER — PHENOL 1.4 % MT LIQD: 1.0000 | OROMUCOSAL | Status: DC | PRN

## 2014-09-10 MED ORDER — SODIUM CHLORIDE 0.9 % IR SOLN
Status: DC | PRN
  Administered 2014-09-10: 1000 mL

## 2014-09-10 MED ORDER — OXYCODONE HCL 5 MG PO TABS
5.0000 mg | ORAL_TABLET | ORAL | Status: DC | PRN
  Administered 2014-09-10 (×2): 10 mg via ORAL
  Administered 2014-09-10 (×2): 5 mg via ORAL
  Administered 2014-09-11: 10 mg via ORAL
  Filled 2014-09-10 (×2): qty 2
  Filled 2014-09-10: qty 1
  Filled 2014-09-10: qty 2
  Filled 2014-09-10: qty 1

## 2014-09-10 MED ORDER — ONDANSETRON HCL 4 MG/2ML IJ SOLN
INTRAMUSCULAR | Status: DC | PRN
  Administered 2014-09-10: 4 mg via INTRAVENOUS

## 2014-09-10 MED ORDER — CEFAZOLIN SODIUM-DEXTROSE 2-3 GM-% IV SOLR
INTRAVENOUS | Status: AC
  Filled 2014-09-10: qty 50

## 2014-09-10 MED ORDER — DEXTROSE-NACL 5-0.9 % IV SOLN
INTRAVENOUS | Status: DC
  Administered 2014-09-10 – 2014-09-11 (×2): via INTRAVENOUS

## 2014-09-10 MED ORDER — PROMETHAZINE HCL 25 MG/ML IJ SOLN: 6.2500 mg | INTRAMUSCULAR | Status: DC | PRN

## 2014-09-10 MED ORDER — METHOCARBAMOL 500 MG PO TABS
500.0000 mg | ORAL_TABLET | Freq: Four times a day (QID) | ORAL | Status: DC | PRN
  Filled 2014-09-10: qty 1

## 2014-09-10 MED ORDER — ACETAMINOPHEN 650 MG RE SUPP: 650.0000 mg | Freq: Four times a day (QID) | RECTAL | Status: DC | PRN

## 2014-09-10 MED ORDER — CEFAZOLIN SODIUM-DEXTROSE 2-3 GM-% IV SOLR
2.0000 g | INTRAVENOUS | Status: AC
  Administered 2014-09-10: 2 g via INTRAVENOUS

## 2014-09-10 MED ORDER — TRANEXAMIC ACID 1000 MG/10ML IV SOLN
2000.0000 mg | Freq: Once | INTRAVENOUS | Status: DC
  Filled 2014-09-10: qty 20

## 2014-09-10 MED ORDER — DOCUSATE SODIUM 100 MG PO CAPS
100.0000 mg | ORAL_CAPSULE | Freq: Two times a day (BID) | ORAL | Status: DC
  Administered 2014-09-10 – 2014-09-11 (×2): 100 mg via ORAL

## 2014-09-10 MED ORDER — LACTATED RINGERS IV SOLN: INTRAVENOUS | Status: DC

## 2014-09-10 MED ORDER — DEXAMETHASONE SODIUM PHOSPHATE 10 MG/ML IJ SOLN
INTRAMUSCULAR | Status: AC
  Filled 2014-09-10: qty 1

## 2014-09-10 MED ORDER — TRAMADOL HCL 50 MG PO TABS: 50.0000 mg | ORAL_TABLET | Freq: Four times a day (QID) | ORAL | Status: DC | PRN

## 2014-09-10 MED ORDER — MEPERIDINE HCL 50 MG/ML IJ SOLN: 6.2500 mg | INTRAMUSCULAR | Status: DC | PRN

## 2014-09-10 MED ORDER — DIPHENHYDRAMINE HCL 12.5 MG/5ML PO ELIX: 12.5000 mg | ORAL_SOLUTION | ORAL | Status: DC | PRN

## 2014-09-10 MED ORDER — PANTOPRAZOLE SODIUM 40 MG PO TBEC
40.0000 mg | DELAYED_RELEASE_TABLET | Freq: Every day | ORAL | Status: DC
  Administered 2014-09-10: 40 mg via ORAL
  Filled 2014-09-10 (×3): qty 1

## 2014-09-10 MED ORDER — METOCLOPRAMIDE HCL 5 MG/ML IJ SOLN: 5.0000 mg | Freq: Three times a day (TID) | INTRAMUSCULAR | Status: DC | PRN

## 2014-09-10 MED ORDER — FENTANYL CITRATE (PF) 100 MCG/2ML IJ SOLN
INTRAMUSCULAR | Status: AC
  Filled 2014-09-10: qty 4

## 2014-09-10 MED ORDER — PROPOFOL INFUSION 10 MG/ML OPTIME
INTRAVENOUS | Status: DC | PRN
  Administered 2014-09-10: 140 ug/kg/min via INTRAVENOUS

## 2014-09-10 MED ORDER — TRANEXAMIC ACID 1000 MG/10ML IV SOLN
2000.0000 mg | INTRAVENOUS | Status: DC | PRN
  Administered 2014-09-10: 2000 mg via TOPICAL

## 2014-09-10 MED ORDER — DEXAMETHASONE SODIUM PHOSPHATE 10 MG/ML IJ SOLN
10.0000 mg | Freq: Once | INTRAMUSCULAR | Status: AC
  Administered 2014-09-10: 10 mg via INTRAVENOUS

## 2014-09-10 MED ORDER — ACETAMINOPHEN 10 MG/ML IV SOLN
INTRAVENOUS | Status: AC
  Filled 2014-09-10: qty 100

## 2014-09-10 MED ORDER — 0.9 % SODIUM CHLORIDE (POUR BTL) OPTIME
TOPICAL | Status: DC | PRN
  Administered 2014-09-10: 1000 mL

## 2014-09-10 MED ORDER — POLYETHYLENE GLYCOL 3350 17 G PO PACK: 17.0000 g | PACK | Freq: Every day | ORAL | Status: DC | PRN

## 2014-09-10 MED ORDER — FENTANYL CITRATE (PF) 100 MCG/2ML IJ SOLN
INTRAMUSCULAR | Status: DC | PRN
  Administered 2014-09-10: 100 ug via INTRAVENOUS

## 2014-09-10 MED ORDER — HYDROMORPHONE HCL 1 MG/ML IJ SOLN
0.2500 mg | INTRAMUSCULAR | Status: DC | PRN
  Administered 2014-09-10 (×4): 0.5 mg via INTRAVENOUS

## 2014-09-10 MED ORDER — BUPIVACAINE HCL (PF) 0.25 % IJ SOLN
INTRAMUSCULAR | Status: AC
  Filled 2014-09-10: qty 30

## 2014-09-10 MED ORDER — MIDAZOLAM HCL 2 MG/2ML IJ SOLN
INTRAMUSCULAR | Status: AC
  Filled 2014-09-10: qty 4

## 2014-09-10 MED ORDER — ACETAMINOPHEN 500 MG PO TABS
1000.0000 mg | ORAL_TABLET | Freq: Four times a day (QID) | ORAL | Status: AC
  Administered 2014-09-10 – 2014-09-11 (×4): 1000 mg via ORAL
  Filled 2014-09-10 (×4): qty 2

## 2014-09-10 MED ORDER — BUPIVACAINE HCL 0.25 % IJ SOLN
INTRAMUSCULAR | Status: DC | PRN
  Administered 2014-09-10: 20 mL

## 2014-09-10 MED ORDER — CHLORHEXIDINE GLUCONATE 4 % EX LIQD: 60.0000 mL | Freq: Once | CUTANEOUS | Status: DC

## 2014-09-10 MED ORDER — HYDROMORPHONE HCL 1 MG/ML IJ SOLN
INTRAMUSCULAR | Status: AC
  Filled 2014-09-10: qty 1

## 2014-09-10 MED ORDER — BUPIVACAINE LIPOSOME 1.3 % IJ SUSP
20.0000 mL | Freq: Once | INTRAMUSCULAR | Status: DC
  Filled 2014-09-10: qty 20

## 2014-09-10 SURGICAL SUPPLY — 63 items
BAG DECANTER FOR FLEXI CONT (MISCELLANEOUS) ×3 IMPLANT
BAG SPEC THK2 15X12 ZIP CLS (MISCELLANEOUS) ×1
BAG ZIPLOCK 12X15 (MISCELLANEOUS) ×3 IMPLANT
BANDAGE ELASTIC 6 VELCRO ST LF (GAUZE/BANDAGES/DRESSINGS) ×3 IMPLANT
BANDAGE ESMARK 6X9 LF (GAUZE/BANDAGES/DRESSINGS) ×1 IMPLANT
BLADE SAG 18X100X1.27 (BLADE) ×3 IMPLANT
BLADE SAW SGTL 11.0X1.19X90.0M (BLADE) ×3 IMPLANT
BNDG ESMARK 6X9 LF (GAUZE/BANDAGES/DRESSINGS) ×3
BOWL SMART MIX CTS (DISPOSABLE) ×3 IMPLANT
CAPT KNEE TOTAL 3 ATTUNE ×3 IMPLANT
CEMENT HV SMART SET (Cement) ×6 IMPLANT
CLOSURE WOUND 1/2 X4 (GAUZE/BANDAGES/DRESSINGS) ×1
CUFF TOURN SGL QUICK 34 (TOURNIQUET CUFF) ×2
CUFF TRNQT CYL 34X4X40X1 (TOURNIQUET CUFF) ×1 IMPLANT
DECANTER SPIKE VIAL GLASS SM (MISCELLANEOUS) ×3 IMPLANT
DRAPE EXTREMITY T 121X128X90 (DRAPE) ×3 IMPLANT
DRAPE POUCH INSTRU U-SHP 10X18 (DRAPES) ×3 IMPLANT
DRAPE U-SHAPE 47X51 STRL (DRAPES) ×3 IMPLANT
DRSG ADAPTIC 3X8 NADH LF (GAUZE/BANDAGES/DRESSINGS) ×3 IMPLANT
DRSG PAD ABDOMINAL 8X10 ST (GAUZE/BANDAGES/DRESSINGS) ×3 IMPLANT
DURAPREP 26ML APPLICATOR (WOUND CARE) ×3 IMPLANT
ELECT REM PT RETURN 9FT ADLT (ELECTROSURGICAL) ×3
ELECTRODE REM PT RTRN 9FT ADLT (ELECTROSURGICAL) ×1 IMPLANT
EVACUATOR 1/8 PVC DRAIN (DRAIN) ×3 IMPLANT
FACESHIELD WRAPAROUND (MASK) ×15 IMPLANT
GAUZE SPONGE 4X4 12PLY STRL (GAUZE/BANDAGES/DRESSINGS) ×3 IMPLANT
GLOVE BIO SURGEON STRL SZ7.5 (GLOVE) IMPLANT
GLOVE BIO SURGEON STRL SZ8 (GLOVE) ×3 IMPLANT
GLOVE BIOGEL PI IND STRL 6.5 (GLOVE) ×3 IMPLANT
GLOVE BIOGEL PI IND STRL 8 (GLOVE) ×1 IMPLANT
GLOVE BIOGEL PI INDICATOR 6.5 (GLOVE) ×6
GLOVE BIOGEL PI INDICATOR 8 (GLOVE) ×2
GLOVE SURG SS PI 6.5 STRL IVOR (GLOVE) ×9 IMPLANT
GOWN STRL REUS W/TWL LRG LVL3 (GOWN DISPOSABLE) ×9 IMPLANT
GOWN STRL REUS W/TWL XL LVL3 (GOWN DISPOSABLE) ×3 IMPLANT
HANDPIECE INTERPULSE COAX TIP (DISPOSABLE) ×2
IMMOBILIZER KNEE 20 (SOFTGOODS) ×3
IMMOBILIZER KNEE 20 THIGH 36 (SOFTGOODS) ×1 IMPLANT
KIT BASIN OR (CUSTOM PROCEDURE TRAY) ×3 IMPLANT
MANIFOLD NEPTUNE II (INSTRUMENTS) ×3 IMPLANT
NDL SAFETY ECLIPSE 18X1.5 (NEEDLE) ×3 IMPLANT
NEEDLE HYPO 18GX1.5 SHARP (NEEDLE) ×9
NS IRRIG 1000ML POUR BTL (IV SOLUTION) ×3 IMPLANT
PACK TOTAL JOINT (CUSTOM PROCEDURE TRAY) ×3 IMPLANT
PADDING CAST COTTON 6X4 STRL (CAST SUPPLIES) ×3 IMPLANT
PEN SKIN MARKING BROAD (MISCELLANEOUS) ×3 IMPLANT
POSITIONER SURGICAL ARM (MISCELLANEOUS) ×3 IMPLANT
SET HNDPC FAN SPRY TIP SCT (DISPOSABLE) ×1 IMPLANT
STRIP CLOSURE SKIN 1/2X4 (GAUZE/BANDAGES/DRESSINGS) ×2 IMPLANT
SUCTION FRAZIER 12FR DISP (SUCTIONS) ×3 IMPLANT
SUT MNCRL AB 4-0 PS2 18 (SUTURE) ×3 IMPLANT
SUT VIC AB 2-0 CT1 27 (SUTURE) ×6
SUT VIC AB 2-0 CT1 TAPERPNT 27 (SUTURE) ×3 IMPLANT
SUT VLOC 180 0 24IN GS25 (SUTURE) ×3 IMPLANT
SYR 20CC LL (SYRINGE) ×3 IMPLANT
SYR 50ML LL SCALE MARK (SYRINGE) ×6 IMPLANT
TOWEL OR 17X26 10 PK STRL BLUE (TOWEL DISPOSABLE) ×3 IMPLANT
TOWEL OR NON WOVEN STRL DISP B (DISPOSABLE) ×3 IMPLANT
TRAY FOLEY W/METER SILVER 14FR (SET/KITS/TRAYS/PACK) ×3 IMPLANT
TRAY FOLEY W/METER SILVER 16FR (SET/KITS/TRAYS/PACK) IMPLANT
WATER STERILE IRR 1500ML POUR (IV SOLUTION) ×3 IMPLANT
WRAP KNEE MAXI GEL POST OP (GAUZE/BANDAGES/DRESSINGS) ×3 IMPLANT
YANKAUER SUCT BULB TIP 10FT TU (MISCELLANEOUS) ×3 IMPLANT

## 2014-09-10 NOTE — Anesthesia Preprocedure Evaluation (Addendum)
Anesthesia Evaluation  Patient identified by MRN, date of birth, ID band Patient awake    Reviewed: Allergy & Precautions, NPO status , Patient's Chart, lab work & pertinent test results  History of Anesthesia Complications (+) PONV  Airway Mallampati: II  TM Distance: >3 FB Neck ROM: Full    Dental no notable dental hx.    Pulmonary neg pulmonary ROS,  breath sounds clear to auscultation  Pulmonary exam normal       Cardiovascular negative cardio ROS Normal cardiovascular examRhythm:Regular Rate:Normal     Neuro/Psych CVA (lacunar infarct. incidental finding on an MRI), No Residual Symptoms negative psych ROS   GI/Hepatic Neg liver ROS, GERD-  Medicated and Controlled,  Endo/Other  negative endocrine ROS  Renal/GU negative Renal ROS  negative genitourinary   Musculoskeletal negative musculoskeletal ROS (+)   Abdominal   Peds negative pediatric ROS (+)  Hematology negative hematology ROS (+)   Anesthesia Other Findings   Reproductive/Obstetrics negative OB ROS                            Anesthesia Physical Anesthesia Plan  ASA: II  Anesthesia Plan: Spinal   Post-op Pain Management:    Induction: Intravenous  Airway Management Planned: Simple Face Mask  Additional Equipment:   Intra-op Plan:   Post-operative Plan: Extubation in OR  Informed Consent: I have reviewed the patients History and Physical, chart, labs and discussed the procedure including the risks, benefits and alternatives for the proposed anesthesia with the patient or authorized representative who has indicated his/her understanding and acceptance.   Dental advisory given  Plan Discussed with: CRNA  Anesthesia Plan Comments:         Anesthesia Quick Evaluation

## 2014-09-10 NOTE — H&P (View-Only) (Signed)
Lance Scott DOB: [birth year] Married / Language: Cleophus Molt / Race: White Male Date of Admission:  09/10/2014 CC:  Left Knee Pain History of Present Illness The patient is a 59 year old male who comes in  for a preoperative History and Physical. The patient is scheduled for a left total knee arthroplasty to be performed by Dr. Dione Plover. Aluisio, MD at [facility] on 09-10-2014. The patient reports left knee symptoms including: pain, swelling, instability and weakness. The patient describes the severity of the symptoms as moderate in severity.The patient feels that the symptoms are improving (but he is off of it more). Risk factors include arthroscopic surgery (ACL reconstruction in 2003 by Dr Theda Sers). He states that the left knee has gotten progressively worse over time. In the past few months he has had a marked increase in pain. He had an ACL reconstruction about 13 years ago with Dr. Theda Sers. He has always had some discomfort, even after reconstruction, but it has gotten worse recently. The knee is not giving out on him anymore. He said he has had a knee injury since teenage years. The knee is now limiting what he can and cannot do. It does hurt with activity, occasionally hurting at rest. He has had injections in the past with slight benefit. AP of both knees and lateral show bone-on-bone arthritis in the medial and patellofemoral compartments of the left knee with a large osteophyte formation. He has evidence of an old ACL reconstruction. At this point, the most predictable means of improving pain and function is total knee arthroplasty. AP of both knees and lateral show bone-on-bone arthritis in the medial and patellofemoral compartments of the left knee with a large osteophyte formation. He has evidence of an old ACL reconstruction. He really does not want injections because he does not want a temporary fix. He wants to be able to get around and do what he desires, and the knee is currently  preventing him from doing so. He does not see any mechanism as to how injections would help get his function back. I agree that the injections may help some of the discomfort but not the function. At this point the most predictable means of improving his pain and function would be total knee arthroplasty. He wants to go ahead and proceed. They have been treated conservatively in the past for the above stated problem and despite conservative measures, they continue to have progressive pain and severe functional limitations and dysfunction. They have failed non-operative management including home exercise, medications, and injections. It is felt that they would benefit from undergoing total joint replacement. Risks and benefits of the procedure have been discussed with the patient and they elect to proceed with surgery. There are no active contraindications to surgery such as ongoing infection or rapidly progressive neurological disease.  Problem List/Past Medical Plantar fasciitis (M72.2) Post-traumatic osteoarthritis of left knee (M17.32) Neck sprain and strain (S13.9XXA)04/14/1999 Gastroesophageal Reflux Disease Sprain/strain, knee, cruciate ligament (844.2)11/07/2001 Hypercholesterolemia History of Lacunar CVS History of Esophageal Stricture  Allergies No Known Drug Allergies  Family History Hypertension mother Diabetes Mellitus father and brother Heart Disease father, brother and grandfather fathers side Cancer father Father Deceased. age 43 Mother Living. age 73  Social History Tobacco use never smoker Number of flights of stairs before winded 4-5 Pain Contract no Tobacco / smoke exposure no Alcohol use current drinker; drinks wine; only occasionally per week Children 3 Copy of Drug/Alcohol Rehab (Previously) no Illicit drug use no Living situation live  with spouse Marital status married Current work status working full time Drug/Alcohol Rehab  (Currently) no Exercise Exercises weekly; does running / walking  Medication History  Aspirin EC (Oral) Specific dose unknown - Active. Lipitor (Oral) Specific dose unknown - Active. Pantoprazole Sodium (40MG  Tablet DR, Oral) Active. Multivitamin Active.  Past Surgical History Arthroscopy of Knee left Colon Polyp Removal - Colonoscopy Sinus Surgery Straighten Nasal Septum Left Knee Surgery Date: 1991.   Review of Systems General Not Present- Chills, Fatigue, Fever, Memory Loss, Night Sweats, Weight Gain and Weight Loss. Skin Not Present- Eczema, Hives, Itching, Lesions and Rash. HEENT Not Present- Dentures, Double Vision, Headache, Hearing Loss, Tinnitus and Visual Loss. Respiratory Not Present- Allergies, Chronic Cough, Coughing up blood, Shortness of breath at rest and Shortness of breath with exertion. Cardiovascular Not Present- Chest Pain, Difficulty Breathing Lying Down, Murmur, Palpitations, Racing/skipping heartbeats and Swelling. Gastrointestinal Not Present- Abdominal Pain, Bloody Stool, Constipation, Diarrhea, Difficulty Swallowing, Heartburn, Jaundice, Loss of appetitie, Nausea and Vomiting. Male Genitourinary Not Present- Blood in Urine, Discharge, Flank Pain, Incontinence, Painful Urination, Urgency, Urinary frequency, Urinary Retention, Urinating at Night and Weak urinary stream. Musculoskeletal Present- Joint Pain. Not Present- Back Pain, Joint Swelling, Morning Stiffness, Muscle Pain, Muscle Weakness and Spasms. Neurological Not Present- Blackout spells, Difficulty with balance, Dizziness, Paralysis, Tremor and Weakness. Psychiatric Not Present- Insomnia.  Vitals  Weight: 210 lb Height: 67.5in Weight was reported by patient. Height was reported by patient. Body Surface Area: 2.08 m Body Mass Index: 32.4 kg/m  BP: 178/96 (Sitting, Right Arm, Standard)   Physical Exam General Mental Status -Alert, cooperative and good historian. General  Appearance-pleasant, Not in acute distress. Orientation-Oriented X3. Build & Nutrition-Well nourished and Well developed.  Head and Neck Head-normocephalic, atraumatic . Neck Global Assessment - supple, no bruit auscultated on the right, no bruit auscultated on the left.  Eye Pupil - Bilateral-Regular and Round. Motion - Bilateral-EOMI.  Chest and Lung Exam Auscultation Breath sounds - clear at anterior chest wall and clear at posterior chest wall. Adventitious sounds - No Adventitious sounds.  Cardiovascular Auscultation Rhythm - Regular rate and rhythm. Heart Sounds - S1 WNL and S2 WNL. Murmurs & Other Heart Sounds - Auscultation of the heart reveals - No Murmurs.  Abdomen Palpation/Percussion Tenderness - Abdomen is non-tender to palpation. Rigidity (guarding) - Abdomen is soft. Auscultation Auscultation of the abdomen reveals - Bowel sounds normal.  Male Genitourinary Note: Not done, not pertinent to present illness   Musculoskeletal Note: On exam he is alert and oriented, in no apparent distress. Evaluation of his hips shows normal range of motion with no discomfort. His right knee shows no effusion, range 0-135 with no tenderness or instability. Left knee with no effusion, slight varus, range 5-125, marked crepitus on range of motion; tenderness, medial greater than lateral, with no instability noted. Pulses, sensation, and motor are intact.  RADIOGRAPHS: AP of both knees and lateral show bone-on-bone arthritis in the medial and patellofemoral compartments of the left knee with a large osteophyte formation. He has evidence of an old ACL reconstruction.   Assessment & Plan Post-traumatic osteoarthritis of left knee (M17.32) Note:Surgical Plans: Left Total Knee Replacement  Disposition: Home  PCP: Dr. Shelia Media - Patient has been seen preoperatively and felt to be stable for surgery.  Topical TXA - History of Lacunar CVA  Anesthesia Issues: Nauseated  with anesthesia  Signed electronically by Joelene Millin, III PA-C

## 2014-09-10 NOTE — Anesthesia Procedure Notes (Signed)
Spinal Patient location during procedure: OR Start time: 09/10/2014 7:12 AM End time: 09/10/2014 7:15 AM Staffing Resident/CRNA: Harle Stanford R Performed by: resident/CRNA  Preanesthetic Checklist Completed: patient identified, site marked, surgical consent, pre-op evaluation, timeout performed, IV checked, risks and benefits discussed and monitors and equipment checked Spinal Block Patient position: sitting Prep: Betadine Patient monitoring: heart rate Approach: midline Location: L3-4 Injection technique: single-shot Needle Needle type: Sprotte  Needle gauge: 24 G Needle length: 9 cm Needle insertion depth: 7 cm Assessment Sensory level: T6 Additional Notes Patient sat on side of bed. SAB without difficulty.

## 2014-09-10 NOTE — Interval H&P Note (Signed)
History and Physical Interval Note:  09/10/2014 6:57 AM  Lance Scott  has presented today for surgery, with the diagnosis of OA LEFT KNEE   The various methods of treatment have been discussed with the patient and family. After consideration of risks, benefits and other options for treatment, the patient has consented to  Procedure(s): LEFT TOTAL KNEE ARTHROPLASTY (Left) as a surgical intervention .  The patient's history has been reviewed, patient examined, no change in status, stable for surgery.  I have reviewed the patient's chart and labs.  Questions were answered to the patient's satisfaction.     Gearlean Alf

## 2014-09-10 NOTE — Transfer of Care (Signed)
Immediate Anesthesia Transfer of Care Note  Patient: Lance Scott  Procedure(s) Performed: Procedure(s): LEFT TOTAL KNEE ARTHROPLASTY (Left)  Patient Location: PACU  Anesthesia Type:Spinal  Level of Consciousness: sedated  Airway & Oxygen Therapy: Patient Spontanous Breathing and Patient connected to face mask oxygen  Post-op Assessment: Report given to RN and Post -op Vital signs reviewed and stable  Post vital signs: Reviewed and stable  Last Vitals:  Filed Vitals:   09/10/14 0543  BP: 157/78  Pulse: 84  Temp: 37.4 C  Resp: 16    Complications: No apparent anesthesia complications

## 2014-09-10 NOTE — Anesthesia Postprocedure Evaluation (Signed)
  Anesthesia Post-op Note  Patient: Lance Scott  Procedure(s) Performed: Procedure(s) (LRB): LEFT TOTAL KNEE ARTHROPLASTY (Left)  Patient Location: PACU  Anesthesia Type: Spinal  Level of Consciousness: awake and alert   Airway and Oxygen Therapy: Patient Spontanous Breathing  Post-op Pain: mild  Post-op Assessment: Post-op Vital signs reviewed, Patient's Cardiovascular Status Stable, Respiratory Function Stable, Patent Airway and No signs of Nausea or vomiting  Last Vitals:  Filed Vitals:   09/10/14 0915  BP: 138/69  Pulse: 58  Temp:   Resp: 13    Post-op Vital Signs: stable   Complications: No apparent anesthesia complications

## 2014-09-10 NOTE — Progress Notes (Signed)
Utilization review completed.  

## 2014-09-10 NOTE — Op Note (Signed)
Pre-operative diagnosis- Osteoarthritis  Left knee(s)  Post-operative diagnosis- Osteoarthritis Left knee(s)  Procedure-  Left  Total Knee Arthroplasty  Surgeon- Lance Scott. Lance Bjorn, MD  Assistant- Lance Jourdain, PA-C   Anesthesia-  Spinal  EBL-* No blood loss amount entered *   Drains Hemovac  Tourniquet time-  Total Tourniquet Time Documented: Thigh (Left) - 49 minutes Total: Thigh (Left) - 49 minutes     Complications- None  Condition-PACU - hemodynamically stable.   Brief Clinical Note   Lance Scott is a 59 y.o. year old male with end stage OA of his left knee with progressively worsening pain and dysfunction. He has constant pain, with activity and at rest and significant functional deficits with difficulties even with ADLs. He has had extensive non-op management including analgesics, injections of cortisone, and home exercise program, but remains in significant pain with significant dysfunction. Radiographs show bone on bone arthritis. He presents now for left Total Knee Arthroplasty.     Procedure in detail---   The patient is brought into the operating room and positioned supine on the operating table. After successful administration of  Spinal,   a tourniquet is placed high on the  Left thigh(s) and the lower extremity is prepped and draped in the usual sterile fashion. Time out is performed by the operating team and then the  Left lower extremity is wrapped in Esmarch, knee flexed and the tourniquet inflated to 300 mmHg.       A midline incision is made with a ten blade through the subcutaneous tissue to the level of the extensor mechanism. A fresh blade is used to make a medial parapatellar arthrotomy. Soft tissue over the proximal medial tibia is subperiosteally elevated to the joint line with a knife and into the semimembranosus bursa with a Cobb elevator. Soft tissue over the proximal lateral tibia is elevated with attention being paid to avoiding the patellar tendon  on the tibial tubercle. The patella is everted, knee flexed 90 degrees and the ACL and PCL are removed. Findings are bone on bone all 3 compartments with massive global osteophytes.        The drill is used to create a starting hole in the distal femur and the canal is thoroughly irrigated with sterile saline to remove the fatty contents. The 5 degree Left  valgus alignment guide is placed into the femoral canal and the distal femoral cutting block is pinned to remove 10 mm off the distal femur. Resection is made with an oscillating saw.      The tibia is subluxed forward and the menisci are removed. The extramedullary alignment guide is placed referencing proximally at the medial aspect of the tibial tubercle and distally along the second metatarsal axis and tibial crest. The block is pinned to remove 36mm off the more deficient medial  side. Resection is made with an oscillating saw. Size 7is the most appropriate size for the tibia and the proximal tibia is prepared with the modular drill and keel punch for that size.      The femoral sizing guide is placed and size 7 is most appropriate. Rotation is marked off the epicondylar axis and confirmed by creating a rectangular flexion gap at 90 degrees. The size 7 cutting block is pinned in this rotation and the anterior, posterior and chamfer cuts are made with the oscillating saw. The intercondylar block is then placed and that cut is made.      Trial size 7 tibial component, trial size 7  posterior stabilized femur and a 8  mm posterior stabilized rotating platform insert trial is placed. Full extension is achieved with excellent varus/valgus and anterior/posterior balance throughout full range of motion. The patella is everted and thickness measured to be 27  mm. Free hand resection is taken to 15 mm, a 38 template is placed, lug holes are drilled, trial patella is placed, and it tracks normally. Osteophytes are removed off the posterior femur with the trial in  place. All trials are removed and the cut bone surfaces prepared with pulsatile lavage. Cement is mixed and once ready for implantation, the size 7 tibial implant, size  7 posterior stabilized femoral component, and the size 38 patella are cemented in place and the patella is held with the clamp. The trial insert is placed and the knee held in full extension. The Exparel (20 ml mixed with 30 ml saline) and .25% Bupivicaine, are injected into the extensor mechanism, posterior capsule, medial and lateral gutters and subcutaneous tissues.  All extruded cement is removed and once the cement is hard the permanent 7 mm posterior stabilized rotating platform insert is placed into the tibial tray.      The wound is copiously irrigated with saline solution and the extensor mechanism closed over a hemovac drain with #1 V-loc suture. The tourniquet is released for a total tourniquet time of 50  minutes. Flexion against gravity is 140 degrees and the patella tracks normally. Subcutaneous tissue is closed with 2.0 vicryl and subcuticular with running 4.0 Monocryl. The incision is cleaned and dried and steri-strips and a bulky sterile dressing are applied. The limb is placed into a knee immobilizer and the patient is awakened and transported to recovery in stable condition.      Please note that a surgical assistant was a medical necessity for this procedure in order to perform it in a safe and expeditious manner. Surgical assistant was necessary to retract the ligaments and vital neurovascular structures to prevent injury to them and also necessary for proper positioning of the limb to allow for anatomic placement of the prosthesis.   Lance Scott Lance Weilbacher, MD    09/10/2014, 8:27 AM

## 2014-09-10 NOTE — Evaluation (Signed)
Physical Therapy Evaluation Patient Details Name: Lance Scott MRN: [id number] DOB: 03-18-1955 Today's Date: 09/10/2014   History of Present Illness  [name]  Clinical Impression  Pt s/p [name] presents with decreased L LE strength/ROM and post op pain limiting functional mobility.  Pt should progress well to dc home with family assist and HHPT follow up.    Follow Up Recommendations Home health PT    Equipment Recommendations  Rolling walker with 5" wheels    Recommendations for Other Services OT consult     Precautions / Restrictions Precautions Precautions: Fall;Knee Required Braces or Orthoses: Knee Immobilizer - Left Knee Immobilizer - Left: Discontinue once straight leg raise with < 10 degree lag Restrictions Weight Bearing Restrictions: No Other Position/Activity Restrictions: WBAT      Mobility  Bed Mobility Overal bed mobility: Needs Assistance Bed Mobility: Supine to Sit;Sit to Supine     Supine to sit: Min assist Sit to supine: Min assist   General bed mobility comments: cues for sequence and use of R LE to self assist  Transfers Overall transfer level: Needs assistance Equipment used: Rolling walker (2 wheeled) Transfers: Sit to/from Stand Sit to Stand: Min assist         General transfer comment: cues for LE management and use of UEs to self assist  Ambulation/Gait Ambulation/Gait assistance: Min assist Ambulation Distance (Feet): 39 Feet Assistive device: Rolling walker (2 wheeled) Gait Pattern/deviations: Step-to pattern;Decreased step length - right;Decreased step length - left;Shuffle;Trunk flexed     General Gait Details: cues for posture, sequence and position from [employer]            Wheelchair Mobility    Modified Rankin (Stroke Patients Only)       Balance                                             Pertinent Vitals/Pain Pain Assessment: 0-10 Pain Score: 4  Pain Location: L knee Pain  Descriptors / Indicators: Aching;Sore Pain Intervention(s): Limited activity within patient's tolerance;Monitored during session;Premedicated before session;Ice applied    Home Living Family/patient expects to be discharged to:: Private residence Living Arrangements: Spouse/significant other Available Help at Discharge: Family Type of Home: House Home Access: Stairs to enter Entrance Stairs-Rails: None Technical brewer of Steps: 2 Home Layout: One level Home Equipment: None      Prior Function Level of Independence: Independent               Hand Dominance        Extremity/Trunk Assessment   Upper Extremity Assessment: Overall WFL for tasks assessed           Lower Extremity Assessment: LLE deficits/detail      Cervical / Trunk Assessment: Normal  Communication   Communication: No difficulties  Cognition Arousal/Alertness: Awake/alert Behavior During Therapy: WFL for tasks assessed/performed Overall Cognitive Status: Within Functional Limits for tasks assessed                      General Comments      Exercises        Assessment/Plan    PT Assessment Patient needs continued PT services  PT Diagnosis Difficulty walking   PT Problem List Decreased strength;Decreased range of motion;Decreased activity tolerance;Decreased mobility;Decreased knowledge of use of DME;Pain  PT Treatment Interventions DME instruction;Gait training;Stair training;Functional mobility  training;Therapeutic activities;Therapeutic exercise;Patient/family education   PT Goals (Current goals can be found in the Care Plan section) Acute Rehab PT Goals Patient Stated Goal: Keep up with my new child and new grandson PT Goal Formulation: With patient Time For Goal Achievement: 09/15/14 Potential to Achieve Goals: Good    Frequency 7X/week   Barriers to discharge        Co-evaluation               End of Session Equipment Utilized During Treatment: Gait  belt;Left knee immobilizer Activity Tolerance: Patient tolerated treatment well Patient left: in bed;with call bell/phone within reach;with family/visitor present Nurse Communication: Mobility status         Time: 1330-1400 PT Time Calculation (min) (ACUTE ONLY): 30 min   Charges:   PT Evaluation $Initial PT Evaluation Tier I: 1 Procedure PT Treatments $Gait Training: 8-22 mins   PT G Codes:        Alahni Varone 10-Oct-2014, 6:02 PM

## 2014-09-11 ENCOUNTER — Encounter (HOSPITAL_COMMUNITY): Payer: Self-pay | Admitting: Orthopedic Surgery

## 2014-09-11 LAB — CBC
HCT: 35.3 % — ABNORMAL LOW (ref 39.0–52.0)
Hemoglobin: 11.3 g/dL — ABNORMAL LOW (ref 13.0–17.0)
MCH: 26.9 pg (ref 26.0–34.0)
MCHC: 32 g/dL (ref 30.0–36.0)
MCV: 84 fL (ref 78.0–100.0)
PLATELETS: 268 10*3/uL (ref 150–400)
RBC: 4.2 MIL/uL — AB (ref 4.22–5.81)
RDW: 13.7 % (ref 11.5–15.5)
WBC: 17 10*3/uL — AB (ref 4.0–10.5)

## 2014-09-11 LAB — BASIC METABOLIC PANEL
Anion gap: 5 (ref 5–15)
BUN: 14 mg/dL (ref 6–20)
CO2: 28 mmol/L (ref 22–32)
Calcium: 8.8 mg/dL — ABNORMAL LOW (ref 8.9–10.3)
Chloride: 101 mmol/L (ref 101–111)
Creatinine, Ser: 1.39 mg/dL — ABNORMAL HIGH (ref 0.61–1.24)
GFR, EST NON AFRICAN AMERICAN: 54 mL/min — AB (ref >60)
Glucose, Bld: 233 mg/dL — ABNORMAL HIGH (ref 65–99)
POTASSIUM: 4.6 mmol/L (ref 3.5–5.1)
SODIUM: 134 mmol/L — AB (ref 135–145)

## 2014-09-11 MED ORDER — HYDROMORPHONE HCL 2 MG PO TABS
2.0000 mg | ORAL_TABLET | ORAL | Status: DC | PRN
  Administered 2014-09-11: 2 mg via ORAL
  Administered 2014-09-11 (×2): 4 mg via ORAL
  Filled 2014-09-11: qty 1
  Filled 2014-09-11: qty 2
  Filled 2014-09-11 (×2): qty 1

## 2014-09-11 MED ORDER — METHOCARBAMOL 500 MG PO TABS: 500.0000 mg | ORAL_TABLET | Freq: Four times a day (QID) | ORAL | Status: DC | PRN

## 2014-09-11 MED ORDER — TRAMADOL HCL 50 MG PO TABS: 50.0000 mg | ORAL_TABLET | Freq: Four times a day (QID) | ORAL | Status: DC | PRN

## 2014-09-11 MED ORDER — HYDROMORPHONE HCL 2 MG PO TABS: 2.0000 mg | ORAL_TABLET | ORAL | Status: DC | PRN

## 2014-09-11 MED ORDER — RIVAROXABAN 10 MG PO TABS: 10.0000 mg | ORAL_TABLET | Freq: Every day | ORAL | Status: DC

## 2014-09-11 NOTE — Discharge Instructions (Signed)
° °Dr. Frank Aluisio °Total Joint Specialist °[facility] °[address] °[place], [place] [address] °[phone] ° °TOTAL KNEE REPLACEMENT POSTOPERATIVE DIRECTIONS ° °Knee Rehabilitation, Guidelines Following Surgery  °Results after knee surgery are often greatly improved when you follow the exercise, range of motion and muscle strengthening exercises prescribed by your doctor. Safety measures are also important to protect the knee from further injury. Any time any of these exercises cause you to have increased pain or swelling in your knee joint, decrease the amount until you are comfortable again and slowly increase them. If you have problems or questions, call your caregiver or physical therapist for advice.  ° °HOME CARE INSTRUCTIONS  °Remove items at home which could result in a fall. This includes throw rugs or furniture in walking pathways.  °· ICE to the affected knee every three hours for 30 minutes at a time and then as needed for pain and swelling.  Continue to use ice on the knee for pain and swelling from surgery. You may notice swelling that will progress down to the foot and ankle.  This is normal after surgery.  Elevate the leg when you are not up walking on it.   °· Continue to use the breathing machine which will help keep your temperature down.  It is common for your temperature to cycle up and down following surgery, especially at night when you are not up moving around and exerting yourself.  The breathing machine keeps your lungs expanded and your temperature down. °· Do not place pillow under knee, focus on keeping the knee straight while resting ° °DIET °You may resume your previous home diet once your are discharged from the hospital. ° °DRESSING / WOUND CARE / SHOWERING °You may shower 3 days after surgery, but keep the wounds dry during showering.  You may use an occlusive plastic wrap (Press'n Seal for example), NO SOAKING/SUBMERGING IN THE BATHTUB.  If the  bandage gets wet, change with a clean dry gauze.  If the incision gets wet, pat the wound dry with a clean towel. °You may start showering once you are discharged home but do not submerge the incision under water. Just pat the incision dry and apply a dry gauze dressing on daily. °Change the surgical dressing daily and reapply a dry dressing each time. ° °ACTIVITY °Walk with your walker as instructed. °Use walker as long as suggested by your caregivers. °Avoid periods of inactivity such as sitting longer than an hour when not asleep. This helps prevent blood clots.  °You may resume a sexual relationship in one month or when given the OK by your doctor.  °You may return to work once you are cleared by your doctor.  °Do not drive a car for 6 weeks or until released by you surgeon.  °Do not drive while taking narcotics. ° °WEIGHT BEARING °Weight bearing as tolerated with assist device (walker, cane, etc) as directed, use it as long as suggested by your surgeon or therapist, typically at least 4-6 weeks. ° °POSTOPERATIVE CONSTIPATION PROTOCOL °Constipation - defined medically as fewer than three stools per week and severe constipation as less than one stool per week. ° °One of the most common issues patients have following surgery is constipation.  Even if you have a regular bowel pattern at home, your normal regimen is likely to be disrupted due to multiple reasons following surgery.  Combination of anesthesia, postoperative narcotics, change in appetite and fluid intake all can affect your bowels.    In order to avoid complications following surgery, here are some recommendations in order to help you during your recovery period. ° °Colace (docusate) - Pick up an over-the-counter form of Colace or another stool softener and take twice a day as long as you are requiring postoperative pain medications.  Take with a full glass of water daily.  If you experience loose stools or diarrhea, hold the colace until you stool forms  back up.  If your symptoms do not get better within 1 week or if they get worse, check with your doctor. ° °Dulcolax (bisacodyl) - Pick up over-the-counter and take as directed by the product packaging as needed to assist with the movement of your bowels.  Take with a full glass of water.  Use this product as needed if not relieved by Colace only.  ° °MiraLax (polyethylene glycol) - Pick up over-the-counter to have on hand.  MiraLax is a solution that will increase the amount of water in your bowels to assist with bowel movements.  Take as directed and can mix with a glass of water, juice, soda, coffee, or tea.  Take if you go more than two days without a movement. °Do not use MiraLax more than once per day. Call your doctor if you are still constipated or irregular after using this medication for 7 days in a row. ° °If you continue to have problems with postoperative constipation, please contact the office for further assistance and recommendations.  If you experience "the worst abdominal pain ever" or develop nausea or vomiting, please contact the office immediatly for further recommendations for treatment. ° °ITCHING ° If you experience itching with your medications, try taking only a single pain pill, or even half a pain pill at a time.  You can also use Benadryl over the counter for itching or also to help with sleep.  ° °TED HOSE STOCKINGS °Wear the elastic stockings on both legs for three weeks following surgery during the day but you may remove then at night for sleeping. ° °MEDICATIONS °See your medication summary on the “After Visit Summary” that the nursing staff will review with you prior to discharge.  You may have some home medications which will be placed on hold until you complete the course of blood thinner medication.  It is important for you to complete the blood thinner medication as prescribed by your surgeon.  Continue your approved medications as instructed at time of  discharge. ° °PRECAUTIONS °If you experience chest pain or shortness of breath - call 911 immediately for transfer to the hospital emergency department.  °If you develop a fever greater that 101 F, purulent drainage from wound, increased redness or drainage from wound, foul odor from the wound/dressing, or calf pain - CONTACT YOUR SURGEON.   °                                                °FOLLOW-UP APPOINTMENTS °Make sure you keep all of your appointments after your operation with your surgeon and caregivers. You should call the office at the above phone number and make an appointment for approximately two weeks after the date of your surgery or on the date instructed by your surgeon outlined in the "After Visit Summary". ° ° °RANGE OF MOTION AND STRENGTHENING EXERCISES  °Rehabilitation of the knee is important following a knee injury or   an operation. After just a few days of immobilization, the muscles of the thigh which control the knee become weakened and shrink (atrophy). Knee exercises are designed to build up the tone and strength of the thigh muscles and to improve knee motion. Often times heat used for twenty to thirty minutes before working out will loosen up your tissues and help with improving the range of motion but do not use heat for the first two weeks following surgery. These exercises can be done on a training (exercise) mat, on the floor, on a table or on a bed. Use what ever works the best and is most comfortable for you Knee exercises include:  Leg Lifts - While your knee is still immobilized in a splint or cast, you can do straight leg raises. Lift the leg to 60 degrees, hold for 3 sec, and slowly lower the leg. Repeat 10-20 times 2-3 times daily. Perform this exercise against resistance later as your knee gets better.  Quad and Hamstring Sets - Tighten up the muscle on the front of the thigh (Quad) and hold for 5-10 sec. Repeat this 10-20 times hourly. Hamstring sets are done by pushing the  foot backward against an object and holding for 5-10 sec. Repeat as with quad sets.   Leg Slides: Lying on your back, slowly slide your foot toward your buttocks, bending your knee up off the floor (only go as far as is comfortable). Then slowly slide your foot back down until your leg is flat on the floor again.  Angel Wings: Lying on your back spread your legs to the side as far apart as you can without causing discomfort.  A rehabilitation program following serious knee injuries can speed recovery and prevent re-injury in the future due to weakened muscles. Contact your doctor or a physical therapist for more information on knee rehabilitation.   IF YOU ARE TRANSFERRED TO A SKILLED REHAB FACILITY If the patient is transferred to a skilled rehab facility following release from the hospital, a list of the current medications will be sent to the facility for the patient to continue.  When discharged from the skilled rehab facility, please have the facility set up the patient's [place] prior to being released. Also, the skilled facility will be responsible for providing the patient with their medications at time of release from the facility to include their pain medication, the muscle relaxants, and their blood thinner medication. If the patient is still at the rehab facility at time of the two week follow up appointment, the skilled rehab facility will also need to assist the patient in arranging follow up appointment in our office and any transportation needs.  MAKE SURE YOU:  Understand these instructions.  Get help right away if you are not doing well or get worse.    Pick up stool softner and laxative for home use following surgery while on pain medications. Do not submerge incision under water. Please use good hand washing techniques while changing dressing each day. May shower starting three days after surgery. Please use a clean towel to pat the incision dry following  showers. Continue to use ice for pain and swelling after surgery. Do not use any lotions or creams on the incision until instructed by your surgeon.  Take Xarelto for two and a half more weeks, then discontinue Xarelto. Once the patient has completed the Xarelto, they may resume the 81 mg Aspirin.

## 2014-09-11 NOTE — Discharge Summary (Signed)
Physician Discharge Summary   Patient ID: Lance Scott MRN: [id number] DOB/AGE: February 10, 1955 59 y.o.  Admit date: 09/10/2014 Discharge date: 09/11/2014  Primary Diagnosis: Osteoarthritis Left knee(s)  Admission Diagnoses:  Past Medical History  Diagnosis Date  . Complication of anesthesia   . PONV (postoperative nausea and vomiting)   . Arthritis   . GERD (gastroesophageal reflux disease)    Discharge Diagnoses:   Principal Problem:   OA (osteoarthritis) of knee  Estimated body mass index is 32.85 kg/(m^2) as calculated from the following:   Height as of this encounter: 5' 7.5" (1.715 m).   Weight as of this encounter: 96.616 kg (213 lb).  Procedure:  Procedure(s) (LRB): LEFT TOTAL KNEE ARTHROPLASTY (Left)   Consults: None  HPI: Lance Scott is a 59 y.o. year old male with end stage OA of his left knee with progressively worsening pain and dysfunction. He has constant pain, with activity and at rest and significant functional deficits with difficulties even with ADLs. He has had extensive non-op management including analgesics, injections of cortisone, and home exercise program, but remains in significant pain with significant dysfunction. Radiographs show bone on bone arthritis. He presents now for left Total Knee Arthroplasty.   Laboratory Data: Admission on 09/10/2014  Component Date Value Ref Range Status  . WBC 09/11/2014 17.0* 4.0 - 10.5 K/uL Final  . RBC 09/11/2014 4.20* 4.22 - 5.81 MIL/uL Final  . Hemoglobin 09/11/2014 11.3* 13.0 - 17.0 g/dL Final  . HCT 09/11/2014 35.3* 39.0 - 52.0 % Final  . MCV 09/11/2014 84.0  78.0 - 100.0 fL Final  . MCH 09/11/2014 26.9  26.0 - 34.0 pg Final  . MCHC 09/11/2014 32.0  30.0 - 36.0 g/dL Final  . RDW 09/11/2014 13.7  11.5 - 15.5 % Final  . Platelets 09/11/2014 268  150 - 400 K/uL Final  . Sodium 09/11/2014 134* 135 - 145 mmol/L Final  . Potassium 09/11/2014 4.6  3.5 - 5.1 mmol/L Final  . Chloride 09/11/2014 101  101 - 111  mmol/L Final  . CO2 09/11/2014 28  22 - 32 mmol/L Final  . Glucose, Bld 09/11/2014 233* 65 - 99 mg/dL Final  . BUN 09/11/2014 14  6 - 20 mg/dL Final  . Creatinine, Ser 09/11/2014 1.39* 0.61 - 1.24 mg/dL Final  . Calcium 09/11/2014 8.8* 8.9 - 10.3 mg/dL Final  . GFR calc non Af Amer 09/11/2014 54* >60 mL/min Final  . GFR calc Af Amer 09/11/2014 >60  >60 mL/min Final   Comment: (NOTE) The eGFR has been calculated using the CKD EPI equation. This calculation has not been validated in all clinical situations. eGFR's persistently <60 mL/min signify possible Chronic Kidney Disease.   Georgiann Hahn gap 09/11/2014 5  5 - 15 Final  Hospital Outpatient Visit on 09/03/2014  Component Date Value Ref Range Status  . MRSA, PCR 09/03/2014 NEGATIVE  NEGATIVE Final  . Staphylococcus aureus 09/03/2014 POSITIVE* NEGATIVE Final   Comment:        The Xpert SA Assay (FDA approved for NASAL specimens in patients over 29 years of age), is one component of a comprehensive surveillance program.  Test performance has been validated by [facility] for patients greater than or equal to 33 year old. It is not intended to diagnose infection nor to guide or monitor treatment.   Marland Kitchen aPTT 09/03/2014 35  24 - 37 seconds Final  . WBC 09/03/2014 7.2  4.0 - 10.5 K/uL Final  . RBC 09/03/2014 4.85  4.22 -  5.81 MIL/uL Final  . Hemoglobin 09/03/2014 13.4  13.0 - 17.0 g/dL Final  . HCT 09/03/2014 41.2  39.0 - 52.0 % Final  . MCV 09/03/2014 84.9  78.0 - 100.0 fL Final  . MCH 09/03/2014 27.6  26.0 - 34.0 pg Final  . MCHC 09/03/2014 32.5  30.0 - 36.0 g/dL Final  . RDW 09/03/2014 13.7  11.5 - 15.5 % Final  . Platelets 09/03/2014 302  150 - 400 K/uL Final  . Sodium 09/03/2014 138  135 - 145 mmol/L Final  . Potassium 09/03/2014 4.5  3.5 - 5.1 mmol/L Final  . Chloride 09/03/2014 103  101 - 111 mmol/L Final  . CO2 09/03/2014 29  22 - 32 mmol/L Final  . Glucose, Bld 09/03/2014 102* 65 - 99 mg/dL Final  . BUN 09/03/2014 12  6 -  20 mg/dL Final  . Creatinine, Ser 09/03/2014 1.07  0.61 - 1.24 mg/dL Final  . Calcium 09/03/2014 9.3  8.9 - 10.3 mg/dL Final  . Total Protein 09/03/2014 7.2  6.5 - 8.1 g/dL Final  . Albumin 09/03/2014 4.2  3.5 - 5.0 g/dL Final  . AST 09/03/2014 18  15 - 41 U/L Final  . ALT 09/03/2014 14* 17 - 63 U/L Final  . Alkaline Phosphatase 09/03/2014 62  38 - 126 U/L Final  . Total Bilirubin 09/03/2014 1.1  0.3 - 1.2 mg/dL Final  . GFR calc non Af Amer 09/03/2014 >60  >60 mL/min Final  . GFR calc Af Amer 09/03/2014 >60  >60 mL/min Final   Comment: (NOTE) The eGFR has been calculated using the CKD EPI equation. This calculation has not been validated in all clinical situations. eGFR's persistently <60 mL/min signify possible Chronic Kidney Disease.   . Anion gap 09/03/2014 6  5 - 15 Final  . Prothrombin Time 09/03/2014 13.3  11.6 - 15.2 seconds Final  . INR 09/03/2014 0.99  0.00 - 1.49 Final  . ABO/RH(D) 09/03/2014 O POS   Final  . Antibody Screen 09/03/2014 NEG   Final  . Sample Expiration 09/03/2014 09/13/2014   Final  . Color, Urine 09/03/2014 YELLOW  YELLOW Final  . APPearance 09/03/2014 CLEAR  CLEAR Final  . Specific Gravity, Urine 09/03/2014 1.014  1.005 - 1.030 Final  . pH 09/03/2014 6.5  5.0 - 8.0 Final  . Glucose, UA 09/03/2014 NEGATIVE  NEGATIVE mg/dL Final  . Hgb urine dipstick 09/03/2014 NEGATIVE  NEGATIVE Final  . Bilirubin Urine 09/03/2014 NEGATIVE  NEGATIVE Final  . Ketones, ur 09/03/2014 NEGATIVE  NEGATIVE mg/dL Final  . Protein, ur 09/03/2014 NEGATIVE  NEGATIVE mg/dL Final  . Urobilinogen, UA 09/03/2014 0.2  0.0 - 1.0 mg/dL Final  . Nitrite 09/03/2014 NEGATIVE  NEGATIVE Final  . Leukocytes, UA 09/03/2014 NEGATIVE  NEGATIVE Final   MICROSCOPIC NOT DONE ON URINES WITH NEGATIVE PROTEIN, BLOOD, LEUKOCYTES, NITRITE, OR GLUCOSE <1000 mg/dL.  . ABO/RH(D) 09/03/2014 O POS   Final     X-Rays:No results found.  EKG:No orders found for this or any previous visit.   Hospital  Course: HARDING THOMURE is a 59 y.o. who was admitted to [facility]. They were brought to the operating room on 09/10/2014 and underwent Procedure(s): LEFT TOTAL KNEE ARTHROPLASTY.  Patient tolerated the procedure well and was later transferred to the recovery room and then to the orthopaedic floor for postoperative care.  They were given PO and IV analgesics for pain control following their surgery.  They were given 24 hours of postoperative antibiotics of  Anti-infectives    Start     Dose/Rate Route Frequency Ordered Stop   09/10/14 1400  ceFAZolin (ANCEF) IVPB 2 g/50 mL premix     2 g 100 mL/hr over 30 Minutes Intravenous Every 6 hours 09/10/14 0957 09/10/14 2034   09/10/14 0615  ceFAZolin (ANCEF) IVPB 2 g/50 mL premix     2 g 100 mL/hr over 30 Minutes Intravenous On call to O.R. 09/10/14 0543 09/10/14 0715     and started on DVT prophylaxis in the form of Xarelto.   PT and OT were ordered for total joint protocol.  Discharge planning consulted to help with postop disposition and equipment needs.  Patient had a good night on the evening of surgery and walked 30 feet the day of surgery.  They started to get up OOB with therapy on day one. Hemovac drain was pulled without difficulty. Patient was seen in rounds and was ready to go home following therapy.  Discharge home with home health if does well Diet - Cardiac diet Follow up - in 2 weeks Activity - WBAT Disposition - Home Condition Upon Discharge - Pending therapy sessions D/C Meds - See DC Summary DVT Prophylaxis - Xarelto  Discharge Instructions    Call MD / Call 911    Complete by:  As directed   If you experience chest pain or shortness of breath, CALL 911 and be transported to the hospital emergency room.  If you develope a fever above 101 F, pus (white drainage) or increased drainage or redness at the wound, or calf pain, call your surgeon's office.     Change dressing    Complete by:  As directed   Change dressing daily  with sterile 4 x 4 inch gauze dressing and apply TED hose. Do not submerge the incision under water.     Constipation Prevention    Complete by:  As directed   Drink plenty of fluids.  Prune juice may be helpful.  You may use a stool softener, such as Colace (over the counter) 100 mg twice a day.  Use MiraLax (over the counter) for constipation as needed.     Diet - low sodium heart healthy    Complete by:  As directed      Discharge instructions    Complete by:  As directed   Pick up stool softner and laxative for home use following surgery while on pain medications. Do not submerge incision under water. May remove the surgical dressing tomorrow, Wednesday 09/12/2014, and then apply a dry gauze dressing. Please use good hand washing techniques while changing dressing each day. May shower starting three days after surgery on Thursday 09/13/2014. Please use a clean towel to pat the incision dry following showers. Continue to use ice for pain and swelling after surgery. Do not use any lotions or creams on the incision until instructed by your surgeon.  Take Xarelto for two and a half more weeks, then discontinue Xarelto. Once the patient has completed the Xarelto, they may resume the 81 mg Aspirin.  Postoperative Constipation Protocol  Constipation - defined medically as fewer than three stools per week and severe constipation as less than one stool per week.  One of the most common issues patients have following surgery is constipation.  Even if you have a regular bowel pattern at home, your normal regimen is likely to be disrupted due to multiple reasons following surgery.  Combination of anesthesia, postoperative narcotics, change in appetite and fluid intake all can  affect your bowels.  In order to avoid complications following surgery, here are some recommendations in order to help you during your recovery period.  Colace (docusate) - Pick up an over-the-counter form of Colace or another  stool softener and take twice a day as long as you are requiring postoperative pain medications.  Take with a full glass of water daily.  If you experience loose stools or diarrhea, hold the colace until you stool forms back up.  If your symptoms do not get better within 1 week or if they get worse, check with your doctor.  Dulcolax (bisacodyl) - Pick up over-the-counter and take as directed by the product packaging as needed to assist with the movement of your bowels.  Take with a full glass of water.  Use this product as needed if not relieved by Colace only.   MiraLax (polyethylene glycol) - Pick up over-the-counter to have on hand.  MiraLax is a solution that will increase the amount of water in your bowels to assist with bowel movements.  Take as directed and can mix with a glass of water, juice, soda, coffee, or tea.  Take if you go more than two days without a movement. Do not use MiraLax more than once per day. Call your doctor if you are still constipated or irregular after using this medication for 7 days in a row.  If you continue to have problems with postoperative constipation, please contact the office for further assistance and recommendations.  If you experience "the worst abdominal pain ever" or develop nausea or vomiting, please contact the office immediatly for further recommendations for treatment.     Do not put a pillow under the knee. Place it under the heel.    Complete by:  As directed      Do not sit on low chairs, stoools or toilet seats, as it may be difficult to get up from low surfaces    Complete by:  As directed      Driving restrictions    Complete by:  As directed   No driving until released by the physician.     Increase activity slowly as tolerated    Complete by:  As directed      Lifting restrictions    Complete by:  As directed   No lifting until released by the physician.     Patient may shower    Complete by:  As directed   You may shower without a  dressing once there is no drainage.  Do not wash over the wound.  If drainage remains, do not shower until drainage stops.     TED hose    Complete by:  As directed   Use stockings (TED hose) for 3 weeks on both leg(s).  You may remove them at night for sleeping.     Weight bearing as tolerated    Complete by:  As directed   Laterality:  left  Extremity:  Lower            Medication List    STOP taking these medications        aspirin EC 81 MG tablet     CIALIS 5 MG tablet  Generic drug:  tadalafil     multivitamin with minerals Tabs tablet     testosterone cypionate 200 MG/ML injection  Commonly known as:  DEPOTESTOSTERONE CYPIONATE      TAKE these medications        HYDROmorphone 2 MG tablet  Commonly  known as:  DILAUDID  Take 1-2 tablets (2-4 mg total) by mouth every 3 (three) hours as needed for moderate pain or severe pain.     methocarbamol 500 MG tablet  Commonly known as:  ROBAXIN  Take 1 tablet (500 mg total) by mouth every 6 (six) hours as needed for muscle spasms.     pantoprazole 40 MG tablet  Commonly known as:  PROTONIX  Take 40 mg by mouth daily.     rivaroxaban 10 MG Tabs tablet  Commonly known as:  XARELTO  Take 1 tablet (10 mg total) by mouth daily with breakfast. Take Xarelto for two and a half more weeks, then discontinue Xarelto. Once the patient has completed the Xarelto, they may resume the 81 mg Aspirin.     traMADol 50 MG tablet  Commonly known as:  ULTRAM  Take 1-2 tablets (50-100 mg total) by mouth every 6 (six) hours as needed (mild pain).           Follow-up Information    Follow up with Gearlean Alf, MD. Schedule an appointment as soon as possible for a visit on 09/25/2014.   Specialty:  Orthopedic Surgery   Why:  Call office at [phone] to setup appointment on Tuesday 09/25/2014 with Dr. Wynelle Link.   Contact information:   [address] [place] [address] [phone]       Signed: [name],  PA-C Orthopaedic Surgery 09/11/2014, 9:38 AM

## 2014-09-11 NOTE — Care Management Note (Signed)
Case Management Note  Patient Details  Name: Lance Scott MRN: [id number] Date of Birth: April 26, 1955  Subjective/Objective:                   LEFT TOTAL KNEE ARTHROPLASTY (Left) Action/Plan:  Discharge planning Expected Discharge Date:  09/11/14               Expected Discharge Plan:  [place]  In-House Referral:     Discharge planning Services  CM Consult  Post Acute Care Choice:  Home Health Choice offered to:  Patient  DME Arranged:  Walker rolling DME Agency:  [place]:  PT [facility] Agency:  [place]  Status of Service:  Completed, signed off  Medicare Important Message Given:    Date Medicare IM Given:    Medicare IM give by:    Date Additional Medicare IM Given:    Additional Medicare Important Message give by:     If discussed at [place] of Stay Meetings, dates discussed:    Additional Comments: CM met with pt and wife in room to offer choice of home health agency.  Pt chooses [facility] to render HHPT.  Address and contact information verified by pt.  Referral given to [facility] rep, Tim (on unit).  CM called [facility] DME rep, Lecretia to please deliver the rolling walker to room so pt can discharge.  No other CM needs were communicated. Dellie Catholic, RN 09/11/2014, 2:27 PM

## 2014-09-11 NOTE — Progress Notes (Signed)
   Subjective: 1 Day Post-Op Procedure(s) (LRB): LEFT TOTAL KNEE ARTHROPLASTY (Left) Patient reports pain as mild.   Patient seen in rounds with Dr. Wynelle Link.  Doing well this morning.  Walked over 30 feet the day of surgery. Patient is well, and has had no acute complaints or problems Patient is ready to go home later today after two sessions of therapy if he does well and feels good. Otherwise, probably tomorrow.  Objective: Vital signs in last 24 hours: Temp:  [97.7 F (36.5 C)-98.5 F (36.9 C)] 98.3 F (36.8 C) (08/16 0557) Pulse Rate:  [55-75] 57 (08/16 0557) Resp:  [12-18] 18 (08/16 0557) BP: (133-150)/(56-81) 133/56 mmHg (08/16 0557) SpO2:  [96 %-100 %] 97 % (08/16 0557)  Intake/Output from previous day:  Intake/Output Summary (Last 24 hours) at 09/11/14 0926 Last data filed at 09/11/14 9892  Gross per 24 hour  Intake 4083.33 ml  Output   2647 ml  Net 1436.33 ml    Intake/Output this shift: Total I/O In: 360 [P.O.:360] Out: 150 [Urine:150]  Labs:  Recent Labs  09/11/14 0455  HGB 11.3*    Recent Labs  09/11/14 0455  WBC 17.0*  RBC 4.20*  HCT 35.3*  PLT 268    Recent Labs  09/11/14 0455  NA 134*  K 4.6  CL 101  CO2 28  BUN 14  CREATININE 1.39*  GLUCOSE 233*  CALCIUM 8.8*   No results for input(s): LABPT, INR in the last 72 hours.  EXAM: General - Patient is Alert, Appropriate and Oriented Extremity - Neurovascular intact Sensation intact distally Dorsiflexion/Plantar flexion intact Dressing - clean, dry Motor Function - intact, moving foot and toes well on exam.   Assessment/Plan: 1 Day Post-Op Procedure(s) (LRB): LEFT TOTAL KNEE ARTHROPLASTY (Left) Procedure(s) (LRB): LEFT TOTAL KNEE ARTHROPLASTY (Left) Past Medical History  Diagnosis Date  . Complication of anesthesia   . PONV (postoperative nausea and vomiting)   . Arthritis   . GERD (gastroesophageal reflux disease)    Principal Problem:   OA (osteoarthritis) of  knee  Estimated body mass index is 32.85 kg/(m^2) as calculated from the following:   Height as of this encounter: 5' 7.5" (1.715 m).   Weight as of this encounter: 96.616 kg (213 lb). Advance diet Up with therapy Discharge home with home health id does well Diet - Cardiac diet Follow up - in 2 weeks Activity - WBAT Disposition - Home Condition Upon Discharge - Pending therapy sessions D/C Meds - See DC Summary DVT Prophylaxis - Xarelto  [name], PA-C Orthopaedic Surgery 09/11/2014, 9:26 AM

## 2014-09-11 NOTE — Progress Notes (Signed)
Physical Therapy Treatment Patient Details Name: Lance Scott MRN: [id number] DOB: Jun 19, 1955 Today's Date: 09/11/2014    History of Present Illness [name]    PT Comments    POD # 1 am session.  Applied KI and instructed pt on use for amb and stairs until able to perform active SLR.  Assisted OOB to amb a limited distance with RW.  Returned to room to assist with static standing for urinal use, then OT arrived.    Follow Up Recommendations  Home health PT     Equipment Recommendations  Rolling walker with 5" wheels    Recommendations for Other Services       Precautions / Restrictions Precautions Precautions: Fall;Knee Precaution Comments: instructed on KI for amb and stairs Required Braces or Orthoses: Knee Immobilizer - Left Knee Immobilizer - Left: Discontinue once straight leg raise with < 10 degree lag Restrictions Weight Bearing Restrictions: No Other Position/Activity Restrictions: WBAT    Mobility  Bed Mobility Overal bed mobility: Needs Assistance Bed Mobility: Supine to Sit     Supine to sit: Min assist     General bed mobility comments: L LE assist OOB  Transfers Overall transfer level: Needs assistance Equipment used: Rolling walker (2 wheeled) Transfers: Sit to/from Stand Sit to Stand: Min guard         General transfer comment: cues for UE/LE placement plus increased time  Ambulation/Gait Ambulation/Gait assistance: Min guard Ambulation Distance (Feet): 45 Feet Assistive device: Rolling walker (2 wheeled) Gait Pattern/deviations: Step-to pattern;Decreased stance time - left Gait velocity: decreased   General Gait Details: cues for posture, sequence and position from [employer]            Wheelchair Mobility    Modified Rankin (Stroke Patients Only)       Balance                                    Cognition Arousal/Alertness: Awake/alert Behavior During Therapy: WFL for tasks assessed/performed Overall  Cognitive Status: Within Functional Limits for tasks assessed                      Exercises      General Comments        Pertinent Vitals/Pain Pain Assessment: 0-10 Pain Score: 5  Pain Location: L knee Pain Descriptors / Indicators: Aching;Sore Pain Intervention(s): Monitored during session;Premedicated before session;Repositioned;Ice applied    Home Living Family/patient expects to be discharged to:: Private residence Living Arrangements: Spouse/significant other           Home Equipment: Shower seat - built in Additional Comments: window sill next to commode    Prior Function Level of Independence: Independent          PT Goals (current goals can now be found in the care plan section) Acute Rehab PT Goals Patient Stated Goal: Keep up with my new child and new grandson Progress towards PT goals: Progressing toward goals    Frequency  7X/week    PT Plan      Co-evaluation             End of Session Equipment Utilized During Treatment: Gait belt;Left knee immobilizer Activity Tolerance: Patient tolerated treatment well Patient left: with call bell/phone within reach;with family/visitor present;in chair     Time: 0950-1020 PT Time Calculation (min) (ACUTE ONLY): 30 min  Charges:  $Gait Training: 8-22  mins $Therapeutic Activity: 8-22 mins                    G Codes:      Rica Koyanagi  PTA [facility]  Acute  Rehab Pager      [phone]

## 2014-09-11 NOTE — Progress Notes (Signed)
Physical Therapy Treatment Patient Details Name: Lance Scott MRN: [id number] DOB: [birth year] Today's Date: 09/11/2014    History of Present Illness [name]    PT Comments    POD # 1 pm session.  Assisted with amb in stairs and practiced stairs.  Performed all supine TKR TE's following HEP.    Follow Up Recommendations  Home health PT     Equipment Recommendations  Rolling walker with 5" wheels    Recommendations for Other Services       Precautions / Restrictions Precautions Precautions: Fall;Knee Precaution Comments: pt able to perform 10 active SLR so advised to wear only for stair safety.  Otherwise D/C KI Required Braces or Orthoses: Knee Immobilizer - Left Knee Immobilizer - Left: Discontinue once straight leg raise with < 10 degree lag Restrictions Weight Bearing Restrictions: No Other Position/Activity Restrictions: WBAT    Mobility  Bed Mobility Overal bed mobility: Needs Assistance Bed Mobility: Supine to Sit;Sit to Supine     Supine to sit: Min guard Sit to supine: Min guard   General bed mobility comments: L LE assist OOB  Transfers Overall transfer level: Needs assistance Equipment used: Rolling walker (2 wheeled) Transfers: Sit to/from Stand Sit to Stand: Supervision;Min guard         General transfer comment: cues for UE/LE placement plus increased time  Ambulation/Gait Ambulation/Gait assistance: Supervision;Min guard Ambulation Distance (Feet): 65 Feet Assistive device: Rolling walker (2 wheeled) Gait Pattern/deviations: Step-to pattern Gait velocity: decreased   General Gait Details: cues for posture, sequence and position from RW   Stairs Stairs: Yes Stairs assistance: Min assist Stair Management: One rail Right;Step to pattern;Forwards Number of Stairs: 2 General stair comments: 25% VC's on proper tech and safety  Wheelchair Mobility    Modified Rankin (Stroke Patients Only)       Balance                                    Cognition Arousal/Alertness: Awake/alert Behavior During Therapy: WFL for tasks assessed/performed Overall Cognitive Status: Within Functional Limits for tasks assessed                      Exercises   Total Knee Replacement TE's 10 reps B LE ankle pumps 10 reps towel squeezes 10 reps knee presses 10 reps heel slides  10 reps SAQ's 10 reps SLR's 10 reps ABD Followed by ICE     General Comments        Pertinent Vitals/Pain Pain Assessment: 0-10 Pain Score: 5  Pain Descriptors / Indicators: Aching;Sore Pain Intervention(s): Monitored during session;Premedicated before session;Repositioned;Ice applied    Home Living                      Prior Function            PT Goals (current goals can now be found in the care plan section) Progress towards PT goals: Progressing toward goals    Frequency  7X/week    PT Plan      Co-evaluation             End of Session Equipment Utilized During Treatment: Gait belt Activity Tolerance: Patient tolerated treatment well Patient left: in bed;with call bell/phone within reach     Time: 1530-1615 PT Time Calculation (min) (ACUTE ONLY): 45 min  Charges:  $Gait Training: 8-22 mins $Therapeutic Exercise: 8-22 mins $  Therapeutic Activity: 8-22 mins                    G Codes:      Rica Koyanagi  PTA [facility]  Acute  Rehab Pager      [phone]

## 2014-09-11 NOTE — Progress Notes (Signed)
Patient cleared from PT stand point and MD. He is being discharged to home with home health PT. Patient is alert and oriented. VS stable. No s/s of acute distress. Reviewed patient education, medications and instructions. Removed IV with catheter intact.

## 2014-09-11 NOTE — Evaluation (Signed)
Occupational Therapy Evaluation Patient Details Name: Lance Scott MRN: [id number] DOB: 01/06/1956 Today's Date: 09/11/2014    History of Present Illness Lance Scott   Clinical Impression   This 59 year old man was admitted for the above surgery. All education was completed. No further OT is needed at this time.    Follow Up Recommendations  Supervision/Assistance - 24 hour    Equipment Recommendations  None recommended by OT (will let know HHPT know if he needs 3:1--likely not)    Recommendations for Other Services       Precautions / Restrictions Precautions Precautions: Fall;Knee Required Braces or Orthoses: Knee Immobilizer - Left Knee Immobilizer - Left: Discontinue once straight leg raise with < 10 degree lag Restrictions Weight Bearing Restrictions: No Other Position/Activity Restrictions: WBAT      Mobility Bed Mobility               General bed mobility comments: oob  Transfers   Equipment used: Rolling walker (2 wheeled) Transfers: Sit to/from Stand Sit to Stand: Min guard         General transfer comment: cues for UE/LE placement    Balance                                            ADL Overall ADL's : Needs assistance/impaired     Grooming: Wash/dry hands;Supervision/safety;Standing   Upper Body Bathing: Set up;Sitting   Lower Body Bathing: Minimal assistance;Sit to/from stand   Upper Body Dressing : Set up;Sitting   Lower Body Dressing: Minimal assistance;Sit to/from stand   Toilet Transfer: Min guard;Ambulation;RW;BSC   Toileting- Water quality scientist and Hygiene: Min guard;Sit to/from stand   Tub/ Shower Transfer: Walk-in shower;Min guard;Shower seat;3 in 1;Ambulation     General ADL Comments: ambulated to bathroom with min guard for bathroom transfers.  Pt has a standard commode but has a window seat.  He was able to get up from comfort height and does not think he will have any trouble.  He will let HH  know if he needs 3:1. Discussed using KI until he is seated on built in seat in shower, not washing over incision, patting dry and reapplying KI prior to getting out of shower     Vision     Perception     Praxis      Pertinent Vitals/Pain Pain Score: 5  Pain Location: Lance knee Pain Descriptors / Indicators: Aching Pain Intervention(s): Limited activity within patient's tolerance;Monitored during session;Premedicated before session;Ice applied;Repositioned     Hand Dominance     Extremity/Trunk Assessment Upper Extremity Assessment Upper Extremity Assessment: Overall WFL for tasks assessed           Communication     Cognition Arousal/Alertness: Awake/alert Behavior During Therapy: WFL for tasks assessed/performed Overall Cognitive Status: Within Functional Limits for tasks assessed                     General Comments       Exercises       Shoulder Instructions      Home Living Family/patient expects to be discharged to:: Private residence Living Arrangements: Spouse/significant other                 Bathroom Shower/Tub: Occupational psychologist: Standard     Home Equipment: Shower seat - built in  Additional Comments: window sill next to commode      Prior Functioning/Environment Level of Independence: Independent             OT Diagnosis: Generalized weakness   OT Problem List:     OT Treatment/Interventions:      OT Goals(Current goals can be found in the care plan section) Acute Rehab OT Goals Patient Stated Goal: Keep up with my new child and new grandson  OT Frequency:     Barriers to D/C:            Co-evaluation              End of Session    Activity Tolerance: Patient tolerated treatment well Patient left: in bed;with call bell/phone within reach   Time: 1021-1033 OT Time Calculation (min): 12 min Charges:  OT General Charges $OT Visit: 1 Procedure OT Evaluation $Initial OT Evaluation  Tier I: 1 Procedure G-Codes:    Lance Scott 01-Oct-2014, 10:39 AM  Lance Scott, OTR/Lance [phone] 01-Oct-2014

## 2015-07-12 ENCOUNTER — Other Ambulatory Visit: Payer: Self-pay | Admitting: Otolaryngology

## 2015-07-12 DIAGNOSIS — R1314 Dysphagia, pharyngoesophageal phase: Secondary | ICD-10-CM

## 2015-07-17 ENCOUNTER — Ambulatory Visit
Admission: RE | Admit: 2015-07-17 | Discharge: 2015-07-17 | Disposition: A | Discharge status: Home / Self Care | Payer: 59 | Source: Ambulatory Visit | Attending: Otolaryngology | Admitting: Otolaryngology

## 2015-07-17 DIAGNOSIS — R1314 Dysphagia, pharyngoesophageal phase: Secondary | ICD-10-CM

## 2015-09-05 ENCOUNTER — Encounter: Payer: Self-pay | Admitting: Internal Medicine

## 2015-09-05 ENCOUNTER — Ambulatory Visit
Admission: RE | Admit: 2015-09-05 | Discharge: 2015-09-05 | Disposition: A | Discharge status: Home / Self Care | Payer: 59 | Source: Ambulatory Visit | Attending: Internal Medicine | Admitting: Internal Medicine

## 2015-09-05 ENCOUNTER — Ambulatory Visit (INDEPENDENT_AMBULATORY_CARE_PROVIDER_SITE_OTHER): Payer: 59 | Admitting: Internal Medicine

## 2015-09-05 VITALS — BP 121/75 | HR 85 | Temp 98.4°F | Wt 187.8 lb

## 2015-09-05 DIAGNOSIS — R509 Fever, unspecified: Secondary | ICD-10-CM

## 2015-09-05 NOTE — Progress Notes (Signed)
RFV: initial visit for FUO  Patient ID: Lance Scott, male   DOB: 29-Sep-1955, 60 y.o.   MRN: [id number]  HPI 60yo M with history of chronic cough, OA, who underwent left TKA in mid Aug 2016. He states that post surgery he did well, some serous drainage from incision, but no overt erythema or purulent drainage. He then noticed having fevers, and nightsweats  since sep 2016 following left TKA. He takes temps range from 99- 102F. It has not been above 100.3 in several weeks. He was started on ppi but did not improve symptoms. He also notices having associated fatigue with fevers. - tested for hiv and hep c which were negative,  ruled out for celiac disease by GI. He has new onset anemia that is a concern for GI loss. He is starting on iron supplementation by pcp  #20 lb in the last year  Works at [employer] - but now in [employer] travel -> [place] 2-18yr ago. 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 B Complex Vitamins (VITAMIN B COMPLEX PO) Take by mouth.  . cholecalciferol (VITAMIN D) 1000 units tablet Take 1,000 Units by mouth 2 (two) times daily.  . Ergocalciferol (VITAMIN D2) 400 units TABS Take by mouth.  . pantoprazole (PROTONIX) 40 MG tablet Take 40 mg by mouth daily.  Marland Kitchen XIGDUO XR 05-998 MG TB24   . [DISCONTINUED] thiamine (VITAMIN B-1) 100 MG tablet Take 100 mg by mouth daily.  Marland Kitchen HYDROmorphone (DILAUDID) 2 MG tablet Take 1-2 tablets (2-4 mg total) by mouth every 3 (three) hours as needed for moderate pain or severe pain. (Patient not taking: Reported on 09/05/2015)  . methocarbamol (ROBAXIN) 500 MG tablet Take 1 tablet (500 mg total) by mouth every 6 (six) hours as needed for muscle spasms. (Patient not taking: Reported on 09/05/2015)  . rivaroxaban (XARELTO) 10 MG TABS tablet Take 1 tablet (10 mg total) by mouth daily with breakfast. Take Xarelto for two and a half more weeks, then discontinue  Xarelto. Once the patient has completed the Xarelto, they may resume the 81 mg Aspirin. (Patient not taking: Reported on 09/05/2015)  . traMADol (ULTRAM) 50 MG tablet Take 1-2 tablets (50-100 mg total) by mouth every 6 (six) hours as needed (mild pain). (Patient not taking: Reported on 09/05/2015)   No facility-administered encounter medications on file as of 09/05/2015.      Patient Active Problem List   Diagnosis Date Noted  . OA (osteoarthritis) of knee 09/10/2014   - left TKA in August 2016 - rhinoplasty - uvula removal - small hiatal hernia - chronic cough Health Maintenance Due  Topic Date Due  . Hepatitis C Screening  1955/07/30  . HIV Screening  09/22/1969  . TETANUS/TDAP  09/22/1973  . COLONOSCOPY  09/22/2004  . ZOSTAVAX  09/23/2014  . INFLUENZA VACCINE  08/27/2015     Review of Systems Fatigue, fevers, chronic dry cough otherwise 10 point ros is negative Physical Exam   BP 121/75 (BP Location: Left Arm, Patient Position: Sitting, Cuff Size: Normal)   Pulse 85   Temp 98.4 F (36.9 C) (Oral)   Wt 187 lb 12.8 oz (85.2 kg)   SpO2 97%   BMI 28.98 kg/m  Physical Exam  Constitutional: He is oriented to person, place, and time. He appears well-developed and well-nourished. No distress.  HENT:  Mouth/Throat: Oropharynx is clear and moist. No oropharyngeal exudate. Surgical  scar on soft palate, no uvula Cardiovascular: Normal rate, regular rhythm and normal heart sounds. Exam reveals no gallop and no friction rub.  No murmur heard.  Pulmonary/Chest: Effort normal and breath sounds normal. No respiratory distress. He has no wheezes.  Abdominal: Soft. Bowel sounds are normal. He exhibits no distension. There is no tenderness.  Lymphadenopathy:  He has no cervical adenopathy.  Neurological: He is alert and oriented to person, place, and time.  Skin: Skin is warm and dry. No rash noted. No erythema.  Psychiatric: He has a normal mood and affect. His behavior is normal.      CBC Lab Results  Component Value Date   WBC 17.0 (H) 09/11/2014   RBC 4.20 (L) 09/11/2014   HGB 11.3 (L) 09/11/2014   HCT 35.3 (L) 09/11/2014   PLT 268 09/11/2014   MCV 84.0 09/11/2014   MCH 26.9 09/11/2014   MCHC 32.0 09/11/2014   RDW 13.7 09/11/2014   BMET Lab Results  Component Value Date   NA 134 (L) 09/11/2014   K 4.6 09/11/2014   CL 101 09/11/2014   CO2 28 09/11/2014   GLUCOSE 233 (H) 09/11/2014   BUN 14 09/11/2014   CREATININE 1.39 (H) 09/11/2014   CALCIUM 8.8 (L) 09/11/2014   GFRNONAA 54 (L) 09/11/2014   GFRAA >60 09/11/2014   Lab Results  Component Value Date   WBC 7.1 09/05/2015   HGB 10.7 (L) 09/05/2015   HCT 35.3 (L) 09/05/2015   MCV 75.1 (L) 09/05/2015   PLT 519 (H) 09/05/2015   Lab Results  Component Value Date   ESRSEDRATE 79 (H) 09/05/2015   Lab Results  Component Value Date   CRP 16.2 (H) 09/05/2015     Assessment and Plan  Will check FUO labs to include inflammatory markers, blood cx, auto-immune.Concern for unintentional weight loss of #20 over the last year.  Addendum: has inflammatory markers elevated. Blood cx remain negative, as does TB work up. Will check protein electropheresis, and do chest/abd/pelvis CT. Recommend to move forward with getting colonoscopy as well.  Spent 45 min with patient with greater than 50% on one on one time with discussion of work up for fever of unknown origin.

## 2015-09-05 NOTE — Patient Instructions (Signed)
Please take your temperature daily, mark time of day you are taking your temperature. If you feel feverish, take additional temperature at that time, for a period of 2 weeks.

## 2015-09-06 LAB — COMPLETE METABOLIC PANEL WITH GFR
ALBUMIN: 3.5 g/dL — AB (ref 3.6–5.1)
ALK PHOS: 62 U/L (ref 40–115)
ALT: 12 U/L (ref 9–46)
AST: 10 U/L (ref 10–35)
BILIRUBIN TOTAL: 0.5 mg/dL (ref 0.2–1.2)
BUN: 14 mg/dL (ref 7–25)
CO2: 25 mmol/L (ref 20–31)
Calcium: 9.5 mg/dL (ref 8.6–10.3)
Chloride: 100 mmol/L (ref 98–110)
Creat: 1.07 mg/dL (ref 0.70–1.25)
GFR, Est African American: 87 mL/min (ref >=60)
GFR, Est Non African American: 75 mL/min (ref >=60)
GLUCOSE: 146 mg/dL — AB (ref 65–99)
Potassium: 4.4 mmol/L (ref 3.5–5.3)
SODIUM: 138 mmol/L (ref 135–146)
TOTAL PROTEIN: 7.1 g/dL (ref 6.1–8.1)

## 2015-09-06 LAB — CBC WITH DIFFERENTIAL/PLATELET
BASOS ABS: 0 {cells}/uL (ref 0–200)
Basophils Relative: 0 %
EOS PCT: 1 %
Eosinophils Absolute: 71 cells/uL (ref 15–500)
HCT: 35.3 % — ABNORMAL LOW (ref 38.5–50.0)
HEMOGLOBIN: 10.7 g/dL — AB (ref 13.2–17.1)
LYMPHS ABS: 1491 {cells}/uL (ref 850–3900)
Lymphocytes Relative: 21 %
MCH: 22.8 pg — AB (ref 27.0–33.0)
MCHC: 30.3 g/dL — AB (ref 32.0–36.0)
MCV: 75.1 fL — ABNORMAL LOW (ref 80.0–100.0)
MONOS PCT: 9 %
MPV: 9.2 fL (ref 7.5–12.5)
Monocytes Absolute: 639 cells/uL (ref 200–950)
NEUTROS PCT: 69 %
Neutro Abs: 4899 cells/uL (ref 1500–7800)
PLATELETS: 519 10*3/uL — AB (ref 140–400)
RBC: 4.7 MIL/uL (ref 4.20–5.80)
RDW: 15.2 % — ABNORMAL HIGH (ref 11.0–15.0)
WBC: 7.1 10*3/uL (ref 3.8–10.8)

## 2015-09-06 LAB — SEDIMENTATION RATE: Sed Rate: 79 mm/hr — ABNORMAL HIGH (ref 0–20)

## 2015-09-06 LAB — C-REACTIVE PROTEIN: CRP: 16.2 mg/dL — ABNORMAL HIGH (ref <0.60)

## 2015-09-06 LAB — RHEUMATOID FACTOR

## 2015-09-06 LAB — ANA,IFA RA DIAG PNL W/RFLX TIT/PATN
ANA: NEGATIVE
Cyclic Citrullin Peptide Ab: <16 Units

## 2015-09-07 LAB — QUANTIFERON TB GOLD ASSAY (BLOOD)
INTERFERON GAMMA RELEASE ASSAY: NEGATIVE
MITOGEN-NIL SO: 6.25 [IU]/mL
QUANTIFERON NIL VALUE: 0.02 [IU]/mL
Quantiferon Tb Ag Minus Nil Value: 0.03 IU/mL

## 2015-09-11 LAB — CULTURE, BLOOD (SINGLE): ORGANISM ID, BACTERIA: NO GROWTH

## 2015-09-12 ENCOUNTER — Other Ambulatory Visit: Payer: Self-pay | Admitting: Internal Medicine

## 2015-09-12 DIAGNOSIS — R509 Fever, unspecified: Secondary | ICD-10-CM

## 2015-09-12 LAB — CULTURE, BLOOD (SINGLE): Organism ID, Bacteria: NO GROWTH

## 2015-09-19 ENCOUNTER — Encounter: Payer: Self-pay | Admitting: Internal Medicine

## 2015-09-19 ENCOUNTER — Ambulatory Visit (INDEPENDENT_AMBULATORY_CARE_PROVIDER_SITE_OTHER): Payer: 59 | Admitting: Internal Medicine

## 2015-09-19 VITALS — BP 138/76 | HR 84 | Temp 98.7°F | Wt 187.0 lb

## 2015-09-19 DIAGNOSIS — R509 Fever, unspecified: Secondary | ICD-10-CM | POA: Diagnosis not present

## 2015-09-19 NOTE — Progress Notes (Signed)
Patient ID: Lance Scott, male   DOB: 24-Jul-1955, 60 y.o.   MRN: [id number]  HPI  Mr Lance Scott is a 60yo M in previous good state of health but has had 3 month history of fever of uknown origin in the setting of also have some weight loss and new onset anemia. he is currently started on liquid iron for iron deficiency anemia. Going to see dr. Paulita Fujita in the mid September to determine if further endoscopy needed  Still has dry cough . Dry throat, thought to be previously related to acid reflux for which he takes pantoprazole  He brings with him his fever curve data for the past few months  Knee still feels warm and swelling in the evenings,  If he takes alleve at lunch time that improves knee symptoms and less fever symptoms  #20 lb weight loss over this time unintentional  Outpatient Encounter Prescriptions as of 09/19/2015  Medication Sig  . B Complex Vitamins (VITAMIN B COMPLEX PO) Take by mouth.  . cholecalciferol (VITAMIN D) 1000 units tablet Take 1,000 Units by mouth 2 (two) times daily.  . Ergocalciferol (VITAMIN D2) 400 units TABS Take by mouth.  . pantoprazole (PROTONIX) 40 MG tablet Take 40 mg by mouth daily.  Marland Kitchen XIGDUO XR 05-998 MG TB24    No facility-administered encounter medications on file as of 09/19/2015.      Patient Active Problem List   Diagnosis Date Noted  . OA (osteoarthritis) of knee 09/10/2014     Health Maintenance Due  Topic Date Due  . Hepatitis C Screening  11-Aug-1955  . HIV Screening  09/22/1969  . TETANUS/TDAP  09/22/1973  . COLONOSCOPY  09/22/2004  . ZOSTAVAX  09/23/2014  . INFLUENZA VACCINE  08/27/2015     Review of Systems Lack of energy, fatigue, fevers though no nightsweats Physical Exam   BP 138/76   Pulse 84   Temp 98.7 F (37.1 C) (Oral)   Wt 187 lb (84.8 kg)   BMI 28.86 kg/m  Physical Exam  Constitutional: He is oriented to person, place, and time. He appears well-developed and well-nourished. No distress.  HENT:    Mouth/Throat: Oropharynx is clear and moist. No oropharyngeal exudate.  Cardiovascular: Normal rate, regular rhythm and normal heart sounds. Exam reveals no gallop and no friction rub.  No murmur heard.  Pulmonary/Chest: Effort normal and breath sounds normal. No respiratory distress. He has no wheezes.  Abdominal: Soft. Bowel sounds are normal. He exhibits no distension. There is no tenderness.  Lymphadenopathy:  He has no cervical adenopathy.  Neurological: He is alert and oriented to person, place, and time.  Skin: Skin is warm and dry. No rash noted. No erythema.  Psychiatric: He has a normal mood and affect. His behavior is normal.    CBC Lab Results  Component Value Date   WBC 7.1 09/05/2015   RBC 4.70 09/05/2015   HGB 10.7 (L) 09/05/2015   HCT 35.3 (L) 09/05/2015   PLT 519 (H) 09/05/2015   MCV 75.1 (L) 09/05/2015   MCH 22.8 (L) 09/05/2015   MCHC 30.3 (L) 09/05/2015   RDW 15.2 (H) 09/05/2015   LYMPHSABS 1,491 09/05/2015   MONOABS 639 09/05/2015   EOSABS 71 09/05/2015   BASOSABS 0 09/05/2015   BMET Lab Results  Component Value Date   NA 138 09/05/2015   K 4.4 09/05/2015   CL 100 09/05/2015   CO2 25 09/05/2015   GLUCOSE 146 (H) 09/05/2015   BUN 14 09/05/2015  CREATININE 1.07 09/05/2015   CALCIUM 9.5 09/05/2015   GFRNONAA 75 09/05/2015   GFRAA 87 09/05/2015     Assessment and Plan  FUO = will check chest ct and  Bone scan to see if knee is highlighted to suggest pji. Still concern for malignancy given weight loss and anemia. Will speak with Dr Paulita Fujita to consider moving up appointment and do endo/colonoscopy. 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

## 2015-09-20 LAB — CYTOMEGALOVIRUS ANTIBODY, IGG: Cytomegalovirus Ab-IgG: 8.7 U/mL — ABNORMAL HIGH (ref <0.60)

## 2015-09-22 LAB — PARVOVIRUS B19 ANTIBODY, IGG AND IGM
PAROVIRUS B19 IGG ABS: 3 — AB (ref <0.9)
PAROVIRUS B19 IGM ABS: 0.3 (ref <0.9)

## 2015-09-23 ENCOUNTER — Other Ambulatory Visit: Payer: Self-pay | Admitting: Internal Medicine

## 2015-09-23 DIAGNOSIS — D509 Iron deficiency anemia, unspecified: Secondary | ICD-10-CM

## 2015-09-23 LAB — PROTEIN ELECTROPHORESIS,RANDOM URN
CREATININE, URINE: 151 mg/dL (ref 20–370)
Protein Creatinine Ratio: 99 mg/g creat (ref 22–128)
TOTAL PROTEIN, URINE: 15 mg/dL (ref 5–25)

## 2015-09-24 ENCOUNTER — Telehealth: Payer: Self-pay | Admitting: *Deleted

## 2015-09-24 ENCOUNTER — Ambulatory Visit: Payer: 59

## 2015-09-24 ENCOUNTER — Other Ambulatory Visit: Payer: Self-pay | Admitting: *Deleted

## 2015-09-24 DIAGNOSIS — D509 Iron deficiency anemia, unspecified: Secondary | ICD-10-CM

## 2015-09-24 DIAGNOSIS — R509 Fever, unspecified: Secondary | ICD-10-CM

## 2015-09-24 LAB — PROTEIN ELECTROPHORESIS, SERUM
Albumin ELP: 3.3 g/dL — ABNORMAL LOW (ref 3.8–4.8)
Alpha-1-Globulin: 0.6 g/dL — ABNORMAL HIGH (ref 0.2–0.3)
Alpha-2-Globulin: 1.2 g/dL — ABNORMAL HIGH (ref 0.5–0.9)
Beta 2: 0.5 g/dL (ref 0.2–0.5)
Beta Globulin: 0.4 g/dL (ref 0.4–0.6)
Gamma Globulin: 1.2 g/dL (ref 0.8–1.7)
Total Protein, Serum Electrophoresis: 7.2 g/dL (ref 6.1–8.1)

## 2015-09-24 LAB — IMMUNOFIXATION ELECTROPHORESIS
IGG (IMMUNOGLOBIN G), SERUM: 1240 mg/dL (ref 694–1618)
IGM, SERUM: 99 mg/dL (ref 48–271)
IgA: 198 mg/dL (ref 81–463)

## 2015-09-24 NOTE — Telephone Encounter (Signed)
Per Dr Baxter Flattery called the patient to have him come for additional testing. The patient is in the area and will come by the clinic and have additional blood drawn.

## 2015-09-26 LAB — HUMAN PARVOVIRUS DNA DETECTION BY PCR: Parvovirus B19 DNA QL PCR: NOT DETECTED

## 2015-09-26 LAB — IMMUNOFIXATION INTE

## 2015-09-27 ENCOUNTER — Telehealth: Payer: Self-pay | Admitting: *Deleted

## 2015-09-27 ENCOUNTER — Other Ambulatory Visit: Payer: Self-pay | Admitting: Gastroenterology

## 2015-09-27 DIAGNOSIS — E611 Iron deficiency: Secondary | ICD-10-CM

## 2015-09-27 DIAGNOSIS — R634 Abnormal weight loss: Secondary | ICD-10-CM

## 2015-09-27 NOTE — Telephone Encounter (Signed)
D the patient to advise him of his appt for bone scan 10/03/15 at 9 am at [facility]. Reminded him of the CT abdomen, chest and pelvis scheduled for him 10/02/15 as well.

## 2015-10-01 ENCOUNTER — Other Ambulatory Visit: Payer: Self-pay | Admitting: *Deleted

## 2015-10-01 DIAGNOSIS — R509 Fever, unspecified: Secondary | ICD-10-CM

## 2015-10-02 ENCOUNTER — Ambulatory Visit
Admission: RE | Admit: 2015-10-02 | Discharge: 2015-10-02 | Disposition: A | Discharge status: Home / Self Care | Payer: 59 | Source: Ambulatory Visit | Attending: Internal Medicine | Admitting: Internal Medicine

## 2015-10-02 ENCOUNTER — Telehealth: Payer: Self-pay | Admitting: *Deleted

## 2015-10-02 ENCOUNTER — Other Ambulatory Visit: Payer: 59

## 2015-10-02 ENCOUNTER — Inpatient Hospital Stay: Admission: RE | Admit: 2015-10-02 | Payer: 59 | Source: Ambulatory Visit

## 2015-10-02 DIAGNOSIS — R509 Fever, unspecified: Secondary | ICD-10-CM

## 2015-10-02 NOTE — Telephone Encounter (Signed)
I left vm to give me a call regarding his CT findings. 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 Please see what you guys can do to get him to urology ASAP. No need for bone scan. Please call him to cancel the appt for bone scan.

## 2015-10-02 NOTE — Telephone Encounter (Signed)
Called Dr Lavetta Nielsen office to advise that the CT scans have been completed and ready for review. Also that we received a call from radiologist about troubling findings. (call taken by Langley Gauss RN) Lance Scott advised she will make sure the doctor saw the results as soon as possible. Will forward the doctor a note as well.

## 2015-10-03 ENCOUNTER — Other Ambulatory Visit: Payer: Self-pay | Admitting: Urology

## 2015-10-03 ENCOUNTER — Encounter (HOSPITAL_COMMUNITY): Payer: 59

## 2015-10-03 NOTE — Progress Notes (Signed)
Please place orders in EPIC as patient is being scheduled for a pre-op appointment at [facility]! Thank you! 

## 2015-10-09 ENCOUNTER — Encounter (HOSPITAL_COMMUNITY): Payer: Self-pay

## 2015-10-09 ENCOUNTER — Encounter (HOSPITAL_COMMUNITY)
Admission: RE | Admit: 2015-10-09 | Discharge: 2015-10-09 | Disposition: A | Discharge status: Home / Self Care | Payer: 59 | Source: Ambulatory Visit | Attending: Urology | Admitting: Urology

## 2015-10-09 HISTORY — DX: Restless legs syndrome: G25.81

## 2015-10-09 HISTORY — DX: Obesity, unspecified: E66.9

## 2015-10-09 HISTORY — DX: Personal history of other diseases of the respiratory system: Z87.09

## 2015-10-09 HISTORY — DX: Personal history of transient ischemic attack (TIA), and cerebral infarction without residual deficits: Z86.73

## 2015-10-09 HISTORY — DX: Effusion, left knee: M25.462

## 2015-10-09 HISTORY — DX: Pneumonia, unspecified organism: J18.9

## 2015-10-09 HISTORY — DX: Pure hypercholesterolemia, unspecified: E78.00

## 2015-10-09 HISTORY — DX: Type 2 diabetes mellitus without complications: E11.9

## 2015-10-09 HISTORY — DX: Anemia, unspecified: D64.9

## 2015-10-09 HISTORY — DX: Dysphagia, unspecified: R13.10

## 2015-10-09 HISTORY — DX: Acute pharyngitis, unspecified: J02.9

## 2015-10-09 HISTORY — DX: Personal history of other diseases of the digestive system: Z87.19

## 2015-10-09 HISTORY — DX: Fever, unspecified: R50.9

## 2015-10-09 HISTORY — DX: Testicular hypofunction: E29.1

## 2015-10-09 HISTORY — DX: Other specified disorders of kidney and ureter: N28.89

## 2015-10-09 HISTORY — DX: Male erectile dysfunction, unspecified: N52.9

## 2015-10-09 HISTORY — DX: Prediabetes: R73.03

## 2015-10-09 HISTORY — DX: Calculus of gallbladder without cholecystitis without obstruction: K80.20

## 2015-10-09 LAB — CBC
HEMATOCRIT: 33.6 % — AB (ref 39.0–52.0)
HEMOGLOBIN: 10.1 g/dL — AB (ref 13.0–17.0)
MCH: 22 pg — ABNORMAL LOW (ref 26.0–34.0)
MCHC: 30.1 g/dL (ref 30.0–36.0)
MCV: 73.2 fL — AB (ref 78.0–100.0)
Platelets: 561 10*3/uL — ABNORMAL HIGH (ref 150–400)
RBC: 4.59 MIL/uL (ref 4.22–5.81)
RDW: 16.7 % — AB (ref 11.5–15.5)
WBC: 7.9 10*3/uL (ref 4.0–10.5)

## 2015-10-09 LAB — BASIC METABOLIC PANEL
ANION GAP: 7 (ref 5–15)
BUN: 16 mg/dL (ref 6–20)
CHLORIDE: 100 mmol/L — AB (ref 101–111)
CO2: 29 mmol/L (ref 22–32)
Calcium: 9.5 mg/dL (ref 8.9–10.3)
Creatinine, Ser: 1.22 mg/dL (ref 0.61–1.24)
GFR calc Af Amer: >60 mL/min (ref >60)
GFR calc non Af Amer: >60 mL/min (ref >60)
Glucose, Bld: 126 mg/dL — ABNORMAL HIGH (ref 65–99)
POTASSIUM: 5.5 mmol/L — AB (ref 3.5–5.1)
SODIUM: 136 mmol/L (ref 135–145)

## 2015-10-09 NOTE — Progress Notes (Signed)
CBC and BMP results in epic per PAT visit 10/09/2015 sent to Dr Louis Meckel

## 2015-10-09 NOTE — Progress Notes (Signed)
OV note per chart per Dr Shelia Media 08/27/2015 EKG per chart 07/15/2015

## 2015-10-09 NOTE — Patient Instructions (Signed)
Lance Scott  10/09/2015   Your procedure is scheduled on: Friday October 11, 2015  Report to [facility]  Entrance take [place]  elevators to 3rd floor to  [place] at 5:30 AM.  Call this number if you have problems the morning of surgery [phone]   Remember: ONLY 1 PERSON MAY GO WITH YOU TO SHORT STAY TO GET  READY MORNING OF [place].  Do not eat food or drink liquids :After Midnight.     Take these medicines the morning of surgery with A SIP OF WATER: Pantoprazole  DO NOT TAKE ANY DIABETIC MEDICATIONS DAY OF YOUR SURGERY                               You may not have any metal on your body including hair pins and              piercings  Do not wear jewelry, lotions, powders or colognes, deodorant                          Men may shave face and neck.   Do not bring valuables to the hospital. [place].  Contacts, dentures or bridgework may not be worn into surgery.  Leave suitcase in the car. After surgery it may be brought to your room.    Special Instructions: FOLLOW SURGEON'S INSTRUCTIONS IN REGARDS TO BOWEL PREPARATION PRIOR TO SURGICAL PROCEDURE DATE               Please read over the following fact sheets you were given:BLOOD TRANSFUSION INFORMATION SHEET  _____________________________________________________________________             [facility] - Preparing for Surgery Before surgery, you can play an important role.  Because skin is not sterile, your skin needs to be as free of germs as possible.  You can reduce the number of germs on your skin by washing with CHG (chlorahexidine gluconate) soap before surgery.  CHG is an antiseptic cleaner which kills germs and bonds with the skin to continue killing germs even after washing. Please DO NOT use if you have an allergy to CHG or antibacterial soaps.  If your skin becomes reddened/irritated stop using the CHG and inform your  nurse when you arrive at Short Stay. Do not shave (including legs and underarms) for at least 48 hours prior to the first CHG shower.  You may shave your face/neck. Please follow these instructions carefully:  1.  Shower with CHG Soap the night before surgery and the  morning of Surgery.  2.  If you choose to wash your hair, wash your hair first as usual with your  normal  shampoo.  3.  After you shampoo, rinse your hair and body thoroughly to remove the  shampoo.                           4.  Use CHG as you would any other liquid soap.  You can apply chg directly  to the skin and wash  Gently with a scrungie or clean washcloth.  5.  Apply the CHG Soap to your body ONLY FROM THE NECK DOWN.   Do not use on face/ open                           Wound or open sores. Avoid contact with eyes, ears mouth and genitals (private parts).                       Wash face,  Genitals (private parts) with your normal soap.             6.  Wash thoroughly, paying special attention to the area where your surgery  will be performed.  7.  Thoroughly rinse your body with warm water from the neck down.  8.  DO NOT shower/wash with your normal soap after using and rinsing off  the CHG Soap.                9.  Pat yourself dry with a clean towel.            10.  Wear clean pajamas.            11.  Place clean sheets on your bed the night of your first shower and do not  sleep with pets. Day of Surgery : Do not apply any lotions/deodorants the morning of surgery.  Please wear clean clothes to the hospital/surgery center.  FAILURE TO FOLLOW THESE INSTRUCTIONS MAY RESULT IN THE CANCELLATION OF YOUR SURGERY PATIENT SIGNATURE_________________________________  NURSE SIGNATURE__________________________________  ________________________________________________________________________  WHAT IS A BLOOD TRANSFUSION? Blood Transfusion Information  A transfusion is the replacement of blood or some of its  parts. Blood is made up of multiple cells which provide different functions.  Red blood cells carry oxygen and are used for blood loss replacement.  White blood cells fight against infection.  Platelets control bleeding.  Plasma helps clot blood.  Other blood products are available for specialized needs, such as hemophilia or other clotting disorders. BEFORE THE TRANSFUSION  Who gives blood for transfusions?   Healthy volunteers who are fully evaluated to make sure their blood is safe. This is blood bank blood. Transfusion therapy is the safest it has ever been in the practice of medicine. Before blood is taken from a donor, a complete history is taken to make sure that person has no history of diseases nor engages in risky social behavior (examples are intravenous drug use or sexual activity with multiple partners). The donor's travel history is screened to minimize risk of transmitting infections, such as malaria. The donated blood is tested for signs of infectious diseases, such as HIV and hepatitis. The blood is then tested to be sure it is compatible with you in order to minimize the chance of a transfusion reaction. If you or a relative donates blood, this is often done in anticipation of surgery and is not appropriate for emergency situations. It takes many days to process the donated blood. RISKS AND COMPLICATIONS Although transfusion therapy is very safe and saves many lives, the main dangers of transfusion include:   Getting an infectious disease.  Developing a transfusion reaction. This is an allergic reaction to something in the blood you were given. Every precaution is taken to prevent this. The decision to have a blood transfusion has been considered carefully by your caregiver before blood is given. Blood is not given unless the benefits outweigh  the risks. AFTER THE TRANSFUSION  Right after receiving a blood transfusion, you will usually feel much better and more energetic.  This is especially true if your red blood cells have gotten low (anemic). The transfusion raises the level of the red blood cells which carry oxygen, and this usually causes an energy increase.  The nurse administering the transfusion will monitor you carefully for complications. HOME CARE INSTRUCTIONS  No special instructions are needed after a transfusion. You may find your energy is better. Speak with your caregiver about any limitations on activity for underlying diseases you may have. SEEK MEDICAL CARE IF:   Your condition is not improving after your transfusion.  You develop redness or irritation at the intravenous (IV) site. SEEK IMMEDIATE MEDICAL CARE IF:  Any of the following symptoms occur over the next 12 hours:  Shaking chills.  You have a temperature by mouth above 102 F (38.9 C), not controlled by medicine.  Chest, back, or muscle pain.  People around you feel you are not acting correctly or are confused.  Shortness of breath or difficulty breathing.  Dizziness and fainting.  You get a rash or develop hives.  You have a decrease in urine output.  Your urine turns a dark color or changes to pink, red, or brown. Any of the following symptoms occur over the next 10 days:  You have a temperature by mouth above 102 F (38.9 C), not controlled by medicine.  Shortness of breath.  Weakness after normal activity.  The white part of the eye turns yellow (jaundice).  You have a decrease in the amount of urine or are urinating less often.  Your urine turns a dark color or changes to pink, red, or brown. Document Released: 01/10/2000 Document Revised: 04/06/2011 Document Reviewed: 08/29/2007 [facility] Patient Information 2014 [place], [place].  _______________________________________________________________________

## 2015-10-10 LAB — HEMOGLOBIN A1C
HEMOGLOBIN A1C: 7.2 % — AB (ref 4.8–5.6)
MEAN PLASMA GLUCOSE: 160 mg/dL

## 2015-10-10 NOTE — Anesthesia Preprocedure Evaluation (Addendum)
Anesthesia Evaluation  Patient identified by MRN, date of birth, ID band Patient awake    Reviewed: Allergy & Precautions, NPO status , Patient's Chart, lab work & pertinent test results  History of Anesthesia Complications (+) PONV and history of anesthetic complications  Airway Mallampati: II  TM Distance: >3 FB Neck ROM: Full    Dental no notable dental hx. (+) Dental Advisory Given, Chipped,    Pulmonary neg pulmonary ROS,    Pulmonary exam normal breath sounds clear to auscultation       Cardiovascular negative cardio ROS Normal cardiovascular exam Rhythm:Regular Rate:Normal     Neuro/Psych Hx of a lacunar infarct, no residual symptoms negative psych ROS   GI/Hepatic Neg liver ROS, GERD  Controlled,  Endo/Other  diabetes  Renal/GU Renal mass  negative genitourinary   Musculoskeletal  (+) Arthritis ,   Abdominal   Peds negative pediatric ROS (+)  Hematology negative hematology ROS (+)   Anesthesia Other Findings   Reproductive/Obstetrics negative OB ROS                           Anesthesia Physical Anesthesia Plan  ASA: II  Anesthesia Plan: General   Post-op Pain Management:    Induction: Intravenous  Airway Management Planned: Oral ETT  Additional Equipment:   Intra-op Plan:   Post-operative Plan: Extubation in OR  Informed Consent: I have reviewed the patients History and Physical, chart, labs and discussed the procedure including the risks, benefits and alternatives for the proposed anesthesia with the patient or authorized representative who has indicated his/her understanding and acceptance.   Dental advisory given  Plan Discussed with: CRNA  Anesthesia Plan Comments:         Anesthesia Quick Evaluation

## 2015-10-11 ENCOUNTER — Inpatient Hospital Stay (HOSPITAL_COMMUNITY): Payer: 59 | Admitting: Anesthesiology

## 2015-10-11 ENCOUNTER — Encounter (HOSPITAL_COMMUNITY)
Admission: RE | Disposition: A | Discharge status: Home / Self Care | Payer: Self-pay | Source: Ambulatory Visit | Attending: Urology

## 2015-10-11 ENCOUNTER — Inpatient Hospital Stay (HOSPITAL_COMMUNITY)
Admission: RE | Admit: 2015-10-11 | Discharge: 2015-10-12 | DRG: 658 | Disposition: A | Discharge status: Home / Self Care | Payer: 59 | Source: Ambulatory Visit | Attending: Urology | Admitting: Urology

## 2015-10-11 ENCOUNTER — Encounter (HOSPITAL_COMMUNITY): Payer: Self-pay | Admitting: *Deleted

## 2015-10-11 DIAGNOSIS — Z833 Family history of diabetes mellitus: Secondary | ICD-10-CM

## 2015-10-11 DIAGNOSIS — C642 Malignant neoplasm of left kidney, except renal pelvis: Secondary | ICD-10-CM | POA: Diagnosis present

## 2015-10-11 DIAGNOSIS — E119 Type 2 diabetes mellitus without complications: Secondary | ICD-10-CM | POA: Diagnosis present

## 2015-10-11 DIAGNOSIS — N2889 Other specified disorders of kidney and ureter: Secondary | ICD-10-CM | POA: Diagnosis present

## 2015-10-11 DIAGNOSIS — Z8673 Personal history of transient ischemic attack (TIA), and cerebral infarction without residual deficits: Secondary | ICD-10-CM

## 2015-10-11 DIAGNOSIS — Z8249 Family history of ischemic heart disease and other diseases of the circulatory system: Secondary | ICD-10-CM | POA: Diagnosis not present

## 2015-10-11 DIAGNOSIS — M1710 Unilateral primary osteoarthritis, unspecified knee: Secondary | ICD-10-CM | POA: Diagnosis present

## 2015-10-11 DIAGNOSIS — K219 Gastro-esophageal reflux disease without esophagitis: Secondary | ICD-10-CM | POA: Diagnosis present

## 2015-10-11 DIAGNOSIS — R634 Abnormal weight loss: Secondary | ICD-10-CM | POA: Diagnosis present

## 2015-10-11 HISTORY — PX: LAPAROSCOPIC NEPHRECTOMY: SHX1930

## 2015-10-11 LAB — TYPE AND SCREEN
ABO/RH(D): O POS
Antibody Screen: NEGATIVE

## 2015-10-11 LAB — HEMOGLOBIN AND HEMATOCRIT, BLOOD
HEMATOCRIT: 28.8 % — AB (ref 39.0–52.0)
HEMOGLOBIN: 8.9 g/dL — AB (ref 13.0–17.0)

## 2015-10-11 LAB — GLUCOSE, CAPILLARY: Glucose-Capillary: 138 mg/dL — ABNORMAL HIGH (ref 65–99)

## 2015-10-11 SURGERY — NEPHRECTOMY, RADICAL, LAPAROSCOPIC, ADULT
Anesthesia: General | Laterality: Left

## 2015-10-11 MED ORDER — SODIUM CHLORIDE 0.9 % IJ SOLN
INTRAMUSCULAR | Status: DC | PRN
  Administered 2015-10-11: 80 mL

## 2015-10-11 MED ORDER — CANAGLIFLOZIN 100 MG PO TABS
100.0000 mg | ORAL_TABLET | Freq: Every day | ORAL | Status: DC
  Filled 2015-10-11: qty 1

## 2015-10-11 MED ORDER — SUGAMMADEX SODIUM 200 MG/2ML IV SOLN
INTRAVENOUS | Status: DC | PRN
  Administered 2015-10-11: 200 mg via INTRAVENOUS

## 2015-10-11 MED ORDER — LIDOCAINE 2% (20 MG/ML) 5 ML SYRINGE
INTRAMUSCULAR | Status: AC
Start: 2015-10-11 — End: 2015-10-11
  Filled 2015-10-11: qty 5

## 2015-10-11 MED ORDER — BUPIVACAINE-EPINEPHRINE 0.5% -1:200000 IJ SOLN
INTRAMUSCULAR | Status: AC
  Filled 2015-10-11: qty 1

## 2015-10-11 MED ORDER — LIDOCAINE 2% (20 MG/ML) 5 ML SYRINGE: INTRAMUSCULAR | Status: DC | PRN

## 2015-10-11 MED ORDER — DIPHENHYDRAMINE HCL 50 MG/ML IJ SOLN: 12.5000 mg | Freq: Four times a day (QID) | INTRAMUSCULAR | Status: DC | PRN

## 2015-10-11 MED ORDER — SCOPOLAMINE 1 MG/3DAYS TD PT72
MEDICATED_PATCH | TRANSDERMAL | Status: AC
  Filled 2015-10-11: qty 1

## 2015-10-11 MED ORDER — PROPOFOL 10 MG/ML IV BOLUS
INTRAVENOUS | Status: DC | PRN
  Administered 2015-10-11: 160 mg via INTRAVENOUS
  Administered 2015-10-11: 40 mg via INTRAVENOUS

## 2015-10-11 MED ORDER — BUPIVACAINE LIPOSOME 1.3 % IJ SUSP
20.0000 mL | Freq: Once | INTRAMUSCULAR | Status: AC
  Administered 2015-10-11: 20 mL
  Filled 2015-10-11: qty 20

## 2015-10-11 MED ORDER — FENTANYL CITRATE (PF) 100 MCG/2ML IJ SOLN
INTRAMUSCULAR | Status: AC
  Filled 2015-10-11: qty 2

## 2015-10-11 MED ORDER — ROCURONIUM BROMIDE 100 MG/10ML IV SOLN
INTRAVENOUS | Status: AC
  Filled 2015-10-11: qty 1

## 2015-10-11 MED ORDER — FENTANYL CITRATE (PF) 100 MCG/2ML IJ SOLN
INTRAMUSCULAR | Status: DC | PRN
  Administered 2015-10-11: 100 ug via INTRAVENOUS
  Administered 2015-10-11: 50 ug via INTRAVENOUS
  Administered 2015-10-11 (×2): 100 ug via INTRAVENOUS
  Administered 2015-10-11 (×3): 50 ug via INTRAVENOUS

## 2015-10-11 MED ORDER — ONDANSETRON HCL 4 MG/2ML IJ SOLN: 4.0000 mg | INTRAMUSCULAR | Status: DC | PRN

## 2015-10-11 MED ORDER — PROPOFOL 10 MG/ML IV BOLUS
INTRAVENOUS | Status: AC
  Filled 2015-10-11: qty 20

## 2015-10-11 MED ORDER — DIPHENHYDRAMINE HCL 12.5 MG/5ML PO ELIX
12.5000 mg | ORAL_SOLUTION | Freq: Four times a day (QID) | ORAL | Status: DC | PRN
  Filled 2015-10-11: qty 10

## 2015-10-11 MED ORDER — ACETAMINOPHEN 10 MG/ML IV SOLN
1000.0000 mg | Freq: Four times a day (QID) | INTRAVENOUS | Status: AC
  Administered 2015-10-11 – 2015-10-12 (×4): 1000 mg via INTRAVENOUS
  Filled 2015-10-11 (×5): qty 100

## 2015-10-11 MED ORDER — DEXAMETHASONE SODIUM PHOSPHATE 10 MG/ML IJ SOLN
INTRAMUSCULAR | Status: DC | PRN
  Administered 2015-10-11: 10 mg via INTRAVENOUS

## 2015-10-11 MED ORDER — METFORMIN HCL ER 500 MG PO TB24
1000.0000 mg | ORAL_TABLET | Freq: Every day | ORAL | Status: DC
  Filled 2015-10-11: qty 2

## 2015-10-11 MED ORDER — ONDANSETRON HCL 4 MG/2ML IJ SOLN
INTRAMUSCULAR | Status: DC | PRN
  Administered 2015-10-11: 4 mg via INTRAVENOUS

## 2015-10-11 MED ORDER — CEFAZOLIN SODIUM-DEXTROSE 2-4 GM/100ML-% IV SOLN
2.0000 g | INTRAVENOUS | Status: AC
  Administered 2015-10-11: 2 g via INTRAVENOUS
  Filled 2015-10-11: qty 100

## 2015-10-11 MED ORDER — MIDAZOLAM HCL 2 MG/2ML IJ SOLN
INTRAMUSCULAR | Status: AC
  Filled 2015-10-11: qty 2

## 2015-10-11 MED ORDER — MORPHINE SULFATE (PF) 4 MG/ML IV SOLN
INTRAVENOUS | Status: AC
  Filled 2015-10-11: qty 2

## 2015-10-11 MED ORDER — SODIUM CHLORIDE 0.9 % IJ SOLN
INTRAMUSCULAR | Status: AC
  Filled 2015-10-11: qty 50

## 2015-10-11 MED ORDER — MIDAZOLAM HCL 5 MG/5ML IJ SOLN
INTRAMUSCULAR | Status: DC | PRN
  Administered 2015-10-11: 2 mg via INTRAVENOUS

## 2015-10-11 MED ORDER — DEXAMETHASONE SODIUM PHOSPHATE 10 MG/ML IJ SOLN
INTRAMUSCULAR | Status: AC
  Filled 2015-10-11: qty 1

## 2015-10-11 MED ORDER — PANTOPRAZOLE SODIUM 40 MG PO TBEC
40.0000 mg | DELAYED_RELEASE_TABLET | Freq: Every day | ORAL | Status: DC
  Administered 2015-10-11: 40 mg via ORAL
  Filled 2015-10-11 (×2): qty 1

## 2015-10-11 MED ORDER — BUPIVACAINE-EPINEPHRINE 0.5% -1:200000 IJ SOLN
INTRAMUSCULAR | Status: DC | PRN
  Administered 2015-10-11: 7 mL

## 2015-10-11 MED ORDER — HYDROCODONE-ACETAMINOPHEN 5-325 MG PO TABS: 1.0000 | ORAL_TABLET | ORAL | Status: DC | PRN

## 2015-10-11 MED ORDER — LACTATED RINGERS IV SOLN
INTRAVENOUS | Status: DC | PRN
  Administered 2015-10-11 (×2): via INTRAVENOUS

## 2015-10-11 MED ORDER — FENTANYL CITRATE (PF) 100 MCG/2ML IJ SOLN
25.0000 ug | INTRAMUSCULAR | Status: DC | PRN
  Administered 2015-10-11 (×2): 50 ug via INTRAVENOUS

## 2015-10-11 MED ORDER — MORPHINE SULFATE (PF) 10 MG/ML IV SOLN
2.0000 mg | INTRAVENOUS | Status: DC | PRN
  Administered 2015-10-11 – 2015-10-12 (×2): 2 mg via INTRAVENOUS
  Filled 2015-10-11 (×2): qty 1

## 2015-10-11 MED ORDER — KETOROLAC TROMETHAMINE 15 MG/ML IJ SOLN
15.0000 mg | Freq: Four times a day (QID) | INTRAMUSCULAR | Status: DC
  Administered 2015-10-11 – 2015-10-12 (×4): 15 mg via INTRAVENOUS
  Filled 2015-10-11 (×4): qty 1

## 2015-10-11 MED ORDER — ONDANSETRON HCL 4 MG/2ML IJ SOLN: 4.0000 mg | Freq: Once | INTRAMUSCULAR | Status: DC | PRN

## 2015-10-11 MED ORDER — SODIUM CHLORIDE 0.45 % IV SOLN
INTRAVENOUS | Status: DC
Start: 2015-10-11 — End: 2015-10-12
  Administered 2015-10-11 – 2015-10-12 (×3): via INTRAVENOUS

## 2015-10-11 MED ORDER — SCOPOLAMINE 1 MG/3DAYS TD PT72
MEDICATED_PATCH | TRANSDERMAL | Status: DC | PRN
  Administered 2015-10-11: 1 via TRANSDERMAL

## 2015-10-11 MED ORDER — ONDANSETRON HCL 4 MG/2ML IJ SOLN
INTRAMUSCULAR | Status: AC
  Filled 2015-10-11: qty 2

## 2015-10-11 MED ORDER — CEFAZOLIN SODIUM-DEXTROSE 2-4 GM/100ML-% IV SOLN
INTRAVENOUS | Status: AC
  Filled 2015-10-11: qty 100

## 2015-10-11 MED ORDER — SUGAMMADEX SODIUM 200 MG/2ML IV SOLN
INTRAVENOUS | Status: AC
  Filled 2015-10-11: qty 2

## 2015-10-11 MED ORDER — ROCURONIUM BROMIDE 10 MG/ML (PF) SYRINGE
PREFILLED_SYRINGE | INTRAVENOUS | Status: DC | PRN
  Administered 2015-10-11: 50 mg via INTRAVENOUS
  Administered 2015-10-11 (×2): 10 mg via INTRAVENOUS

## 2015-10-11 MED ORDER — HYDROCODONE-ACETAMINOPHEN 5-325 MG PO TABS: 1.0000 | ORAL_TABLET | Freq: Four times a day (QID) | ORAL | 0 refills | Status: DC | PRN

## 2015-10-11 MED ORDER — LIDOCAINE 2% (20 MG/ML) 5 ML SYRINGE
INTRAMUSCULAR | Status: DC | PRN
  Administered 2015-10-11: 80 mg via INTRAVENOUS

## 2015-10-11 MED ORDER — DAPAGLIFLOZIN PRO-METFORMIN ER 5-1000 MG PO TB24: 1.0000 | ORAL_TABLET | Freq: Every day | ORAL | Status: DC

## 2015-10-11 SURGICAL SUPPLY — 62 items
APPLICATOR ARISTA FLEXITIP XL (MISCELLANEOUS) IMPLANT
APPLICATOR SURGIFLO ENDO (HEMOSTASIS) ×3 IMPLANT
APPLIER CLIP ROT 10 11.4 M/L (STAPLE)
APR CLP MED LRG 11.4X10 (STAPLE)
BAG LAPAROSCOPIC 12 15 PORT 16 (BASKET) ×1 IMPLANT
BAG RETRIEVAL 12/15 (BASKET) ×2
BAG RETRIEVAL 12/15MM (BASKET) ×1
BAG ZIPLOCK 12X15 (MISCELLANEOUS) ×3 IMPLANT
BLADE EXTENDED COATED 6.5IN (ELECTRODE) IMPLANT
BLADE SURG SZ10 CARB STEEL (BLADE) ×3 IMPLANT
CHLORAPREP W/TINT 26ML (MISCELLANEOUS) ×3 IMPLANT
CLIP APPLIE ROT 10 11.4 M/L (STAPLE) IMPLANT
CLIP LIGATING HEM O LOK PURPLE (MISCELLANEOUS) ×3 IMPLANT
CLIP LIGATING HEMO LOK XL GOLD (MISCELLANEOUS) IMPLANT
CLIP LIGATING HEMO O LOK GREEN (MISCELLANEOUS) ×3 IMPLANT
COVER SURGICAL LIGHT HANDLE (MISCELLANEOUS) ×3 IMPLANT
CUTTER FLEX LINEAR 45M (STAPLE) ×3 IMPLANT
DECANTER SPIKE VIAL GLASS SM (MISCELLANEOUS) ×3 IMPLANT
DRAIN CHANNEL 10F 3/8 F FF (DRAIN) IMPLANT
DRAPE INCISE IOBAN 66X45 STRL (DRAPES) ×3 IMPLANT
DRAPE WARM FLUID 44X44 (DRAPE) IMPLANT
ELECT PENCIL ROCKER SW 15FT (MISCELLANEOUS) ×3 IMPLANT
ELECT REM PT RETURN 9FT ADLT (ELECTROSURGICAL) ×3
ELECTRODE REM PT RTRN 9FT ADLT (ELECTROSURGICAL) ×1 IMPLANT
EVACUATOR SILICONE 100CC (DRAIN) IMPLANT
GLOVE BIOGEL M STRL SZ7.5 (GLOVE) ×6 IMPLANT
GOWN STRL REUS W/TWL LRG LVL3 (GOWN DISPOSABLE) ×6 IMPLANT
HEMOSTAT ARISTA ABSORB 3G PWDR (MISCELLANEOUS) IMPLANT
HEMOSTAT SURGICEL 2X14 (HEMOSTASIS) ×3 IMPLANT
HEMOSTAT SURGICEL 4X8 (HEMOSTASIS) IMPLANT
IRRIG SUCT STRYKERFLOW 2 WTIP (MISCELLANEOUS) ×3
IRRIGATION SUCT STRKRFLW 2 WTP (MISCELLANEOUS) ×1 IMPLANT
KIT BASIN OR (CUSTOM PROCEDURE TRAY) ×3 IMPLANT
LIQUID BAND (GAUZE/BANDAGES/DRESSINGS) ×3 IMPLANT
MANIFOLD NEPTUNE II (INSTRUMENTS) ×3 IMPLANT
NEEDLE SPNL 18GX3.5 QUINCKE PK (NEEDLE) ×3 IMPLANT
PAD POSITIONING PINK XL (MISCELLANEOUS) ×3 IMPLANT
POSITIONER SURGICAL ARM (MISCELLANEOUS) ×6 IMPLANT
RELOAD 45 VASCULAR/THIN (ENDOMECHANICALS) ×6 IMPLANT
RELOAD STAPLE TA45 3.5 REG BLU (ENDOMECHANICALS) IMPLANT
RETRACTOR LAPSCP 12X46 CVD (ENDOMECHANICALS) IMPLANT
RTRCTR LAPSCP 12X46 CVD (ENDOMECHANICALS)
SCISSORS LAP 5X35 DISP (ENDOMECHANICALS) IMPLANT
SHEARS HARMONIC ACE PLUS 36CM (ENDOMECHANICALS) ×3 IMPLANT
SLEEVE XCEL OPT CAN 5 100 (ENDOMECHANICALS) ×6 IMPLANT
SPONGE LAP 4X18 X RAY DECT (DISPOSABLE) IMPLANT
SPONGE SURGIFOAM ABS GEL 100 (HEMOSTASIS) IMPLANT
SUT ETHILON 3 0 PS 1 (SUTURE) IMPLANT
SUT MNCRL AB 4-0 PS2 18 (SUTURE) ×6 IMPLANT
SUT PDS AB 0 CT1 36 (SUTURE) ×6 IMPLANT
SUT VIC AB 2-0 CT1 27 (SUTURE) ×2
SUT VIC AB 2-0 CT1 27XBRD (SUTURE) ×1 IMPLANT
SUT VICRYL 0 UR6 27IN ABS (SUTURE) ×12 IMPLANT
TAPE CLOTH 4X10 WHT NS (GAUZE/BANDAGES/DRESSINGS) ×6 IMPLANT
TOWEL OR 17X26 10 PK STRL BLUE (TOWEL DISPOSABLE) ×3 IMPLANT
TOWEL OR NON WOVEN STRL DISP B (DISPOSABLE) ×3 IMPLANT
TRAY FOLEY W/METER SILVER 16FR (SET/KITS/TRAYS/PACK) ×3 IMPLANT
TRAY LAPAROSCOPIC (CUSTOM PROCEDURE TRAY) ×3 IMPLANT
TROCAR BLADELESS OPT 5 100 (ENDOMECHANICALS) ×3 IMPLANT
TROCAR XCEL 12X100 BLDLESS (ENDOMECHANICALS) ×3 IMPLANT
WATER STERILE IRR 1000ML POUR (IV SOLUTION) ×3 IMPLANT
YANKAUER SUCT BULB TIP 10FT TU (MISCELLANEOUS) IMPLANT

## 2015-10-11 NOTE — Transfer of Care (Signed)
Immediate Anesthesia Transfer of Care Note  Patient: Lance Scott  Procedure(s) Performed: Procedure(s): LEFT LAPAROSCOPIC RADICAL NEPHRECTOMY (Left)  Patient Location: PACU  Anesthesia Type:General  Level of Consciousness: sedated  Airway & Oxygen Therapy: Patient Spontanous Breathing and Patient connected to face mask oxygen  Post-op Assessment: Report given to RN and Post -op Vital signs reviewed and stable  Post vital signs: Reviewed and stable  Last Vitals:  Vitals:   10/11/15 0549  BP: 129/63  Pulse: 84  Resp: 18  Temp: 37.5 C    Last Pain:  Vitals:   10/11/15 0549  TempSrc: Oral      Patients Stated Pain Goal: 4 ([id number])  Complications: No apparent anesthesia complications

## 2015-10-11 NOTE — Op Note (Signed)
Preoperative diagnosis:  1. Left renal mass   Postoperative diagnosis:  1. same   Procedure: 1. Laparoscopic left radical nephrectomy  Surgeon: Ardis Hughs, MD First Assistant: Debbrah Alar, [facility]  Anesthesia: General  Complications: None  Intraoperative findings: Tumor appeared to be contained within [place]. Adrenal Gland was included in the specimen.  EBL: 50cc  Specimens:  Left kidney and proximal ureter  Indication: Lance Scott is a 60 y.o. patient with large left renal mass, unexplained weight loss, and night sweats.  After reviewing the management options for treatment, he elected to proceed with the above surgical procedure(s). We have discussed the potential benefits and risks of the procedure, side effects of the proposed treatment, the likelihood of the patient achieving the goals of the procedure, and any potential problems that might occur during the procedure or recuperation. Informed consent has been obtained.  Description of procedure:  The case was performed hand and hand with my first assistant, PA Debbrah Alar, and I could not do the case without her. She was involved from placing the ports, intraoperative procedure, and closing of the port incisions.  A site was selected lateral to the umbilicus for placement of the camera port. This was placed using a standard open Hassan technique which allowed entry into the peritoneal cavity under direct vision and without difficulty. A 12 mm Hassan cannula was placed and a pneumoperitoneum established. The camera was then used to inspect the abdomen and there was no evidence of any intra-abdominal injuries or other abnormalities. The remaining abdominal ports were then placed. One 5 mm trocar was placed subcostal margin in the left upper quadrant, the second 5 mm trocar was placed laterally to the camera port so as to triangulate the kidney. An assistant port was then placed in between the camera and the left  lateral port. The assistant port was a 12 mm port.   The white line of Toldt was incised allowing the colon to be mobilized medially and the plane between the mesocolon and the anterior layer of Gerota's fascia to be developed and the kidney exposed. The ureter and gonadal vein were identified inferiorly and the ureter was lifted anteriorly off the psoas muscle. The gonadal vein was spared. A second 5 mm port was placed in the left lower quadrant to help facilitate lifting up of the kidney. Dissection proceeded superiorly along the gonadal vein until the renal vein was identified. The renal hilum was then carefully isolated with a combination of blunt and sharp dissectiong allowing the renal arterial and venous structures to be separated and isolated.   The renal artery was isolated and ligated with a 45 mm Flex ETS stapler. The renal vein was then isolated and also ligated and divided with a 45 mm Flex ETS stapler.   Once the hilum had been ligated dissection ensued from the inferior pole of the kidney. The ureter was transected placing 2 clips on the stay side and one on the specimen side. The lateral attachments of the kidney were then freed. Our attention was then turned to the upper pole which was dissected off of the spleen and the splenic attachments. The pancreas and colon were noted to be well away from the structures. The remaining of the posterior aspect of the attachments was then ligated using a Harmonic Scalpel. Once the kidney was freed from its attachments it was shown to medially and the vascular hilum was inspected and noted to be sufficiently hemostatic and Surgicel placed over  top.   80cc of Exparel was then injected into the left anterior axillary line b/w the iliac crest and the twelfth rib under laparoscopic guidance. The layer between the tranversus abdominus and the internal oblique was targeted.   The kidney/ureter specimen was then placed into a 12 mm Endocatch II retrieval bag,  this was passed through the port site of the assistant port and left lower quadrant. The trochars were then removed under visual guidance to ensure no ongoing port site bleeding was occurring. The extraction incision was extended from the 12 mm left lower quadrant port. The external oblique and internal oblique muscles were spread as best as possible with as little muscle fibers ligated as possible in order to safely extracted the specimen. The internal oblique flash of was then closed with 2-0 Vicryl in an interrupted figure-of-eight fashion. The external oblique fascia was closed with a 2-0 Vicryl. The camera port was then closed with 2-0 Vicryl the level of the fascia. All incisions were injected with Exparel and reapproximated at the skin with 4-0 monocryl sutures. Dermabond was applied to the skin. The patient tolerated the procedure well and without complications and was transferred to the recovery unit in satisfactory condition.

## 2015-10-11 NOTE — Anesthesia Postprocedure Evaluation (Signed)
Anesthesia Post Note  Patient: Lance Scott  Procedure(s) Performed: Procedure(s) (LRB): LEFT LAPAROSCOPIC RADICAL NEPHRECTOMY (Left)  Patient location during evaluation: PACU Anesthesia Type: General Level of consciousness: awake and alert Pain management: pain level controlled Vital Signs Assessment: post-procedure vital signs reviewed and stable Respiratory status: spontaneous breathing, nonlabored ventilation, respiratory function stable and patient connected to nasal cannula oxygen Cardiovascular status: blood pressure returned to baseline and stable Postop Assessment: no signs of nausea or vomiting Anesthetic complications: no    Last Vitals:  Vitals:   10/11/15 1138 10/11/15 1147  BP: (!) 144/79 135/80  Pulse:    Resp: 15 16  Temp:  36.9 C    Last Pain:  Vitals:   10/11/15 1303  TempSrc:   PainSc: 1                  [name]

## 2015-10-11 NOTE — Anesthesia Procedure Notes (Signed)
Procedure Name: Intubation Date/Time: 10/11/2015 7:47 AM Performed by: Lind Covert Pre-anesthesia Checklist: Patient identified, Emergency Drugs available, Suction available, Patient being monitored and Timeout performed Patient Re-evaluated:Patient Re-evaluated prior to inductionOxygen Delivery Method: Circle system utilized Preoxygenation: Pre-oxygenation with 100% oxygen Intubation Type: IV induction Ventilation: Mask ventilation without difficulty Laryngoscope Size: Mac and 4 Grade View: Grade II Tube type: Oral Tube size: 7.5 mm Number of attempts: 2 Airway Equipment and Method: Stylet Placement Confirmation: ETT inserted through vocal cords under direct vision and positive ETCO2 Secured at: 22 cm Tube secured with: Tape Dental Injury: Teeth and Oropharynx as per pre-operative assessment  Comments: First DL by Bailey Mech. Grade III view. No ETCO2 or breath sounds detected. ETT removed, patient mask ventilated. Second DL by CRNA. Grade II view with laryngeal lift. Hockey stick curve to pass ETT through cords. Patient has large epiglottis. ETCO2 and bilateral breath sounds noted. OG tube placed to decompress stomach. Sharyn Dross, SRNA

## 2015-10-11 NOTE — H&P (Signed)
Renal Mass  HPI: Lance Scott is a 60 year-old male patient who was referred by Dr. Satira Anis. Outlaw, MD who is here further eval and management of a renal mass.  The mass is on the left side.   The lesion(s) was first noted on approximately 10/02/2015. The mass was seen on CT Scan.   His symptoms include back pain, fever, and chills. Patient denies having flank pain, groin pain, nausea, vomiting, and blood in urine. He has not seen blood in his urine. He does not have a good appetite. He is not having pain in new locations. He has recently had unwanted weight loss.   He has not had previous abdominal surgery. The patient can walk a flight of steps.   The patient's past medical history is significant for diabetes. There is not a a family history of kidney cancer. There is no family history of brain tumors (AMLs), seizures or brain aneurysm's.   During fever of unknown origin workup he was noted to be anemic and had 20 pound weight loss unintentionally. CT scan was done demonstrating large left renal mass.     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, Hypogonadism, testicular - 2014, Hypogonadism, - 2014    NON-GU PMH: Personal history of other diseases of the digestive system, History of esophageal reflux - 2014 Encounter for general adult medical examination without abnormal findings, Encounter for preventive health examination Gastro-esophageal reflux disease without esophagitis Type 2 diabetes mellitus without  complications Unspecified osteoarthritis, unspecified site    FAMILY HISTORY: 1 Daughter - Daughter 3 sons - Son Cancer - Runs In Family Coronary Artery Disease - Runs In Family Diabetes - Runs In Family Family Health Status Number - Runs In Family Pure Hypercholesterolemia - Mother, Brother   SOCIAL HISTORY: Marital Status: Married Current Smoking Status: Patient has never smoked.  Has never drank.  Drinks 2 caffeinated drinks per day. Patient's occupation is/was Retired from [employer].     Notes: Occupation:, Tobacco Use, Marital History - Currently Married, Caffeine Use, Alcohol Use, Death In The Family Father   REVIEW OF SYSTEMS:    GU Review Male:   Patient reports get up at night to urinate. Patient denies frequent urination, hard to postpone urination, burning/ pain with urination, leakage of urine, stream starts and stops, trouble starting your stream, have to strain to urinate , erection problems, and penile pain.  Gastrointestinal (Upper):   Patient denies nausea, vomiting, and indigestion/ heartburn.  Gastrointestinal (Lower):   Patient denies diarrhea and constipation.  Constitutional:   Patient reports fever, night sweats, weight loss, and fatigue.   Skin:   Patient denies skin rash/ lesion and itching.  Eyes:   Patient denies blurred vision and double vision.  Ears/ Nose/ Throat:   Patient denies sore throat and sinus problems.  Hematologic/Lymphatic:   Patient denies swollen glands and easy bruising.  Cardiovascular:   Patient denies leg swelling and chest pains.  Respiratory:   Patient reports cough. Patient denies shortness of breath.  Endocrine:   Patient denies excessive thirst.  Musculoskeletal:   Patient reports back pain and joint pain.   Neurological:   Patient denies headaches and dizziness.  Psychologic:   Patient denies depression and anxiety.   VITAL SIGNS:      10/03/2015 12:17 PM  Weight 182 lb / 82.55 kg  Height 67 in / 170.18 cm  BP 146/73 mmHg  Pulse  89 /min  BMI 28.5 kg/m   MULTI-SYSTEM PHYSICAL EXAMINATION:    Constitutional: Well-nourished. No physical deformities. Normally developed. Good grooming.  Neck: Neck symmetrical, not swollen. Normal tracheal position.  Respiratory: No labored breathing, no use of accessory muscles.   Cardiovascular: Normal temperature, normal extremity pulses, no swelling, no varicosities.  Lymphatic: No enlargement of neck, axillae, groin.  Skin: No paleness, no jaundice, no cyanosis. No lesion, no ulcer, no rash.  Neurologic / Psychiatric: Oriented to time, oriented to place, oriented to person. No depression, no anxiety, no agitation.  Gastrointestinal: No mass, no tenderness, no rigidity, non obese abdomen.  Eyes: Normal conjunctivae. Normal eyelids.  Ears, Nose, Mouth, and Throat: Left ear no scars, no lesions, no masses. Right ear no scars, no lesions, no masses. Nose no scars, no lesions, no masses. Normal hearing. Normal lips.  Musculoskeletal: Normal gait and station of head and neck.     PAST DATA REVIEWED:  Source Of History:  Patient  X-Ray Review: C.T. Abdomen/Pelvis: Reviewed Films. Reviewed Report. Discussed With Patient. 12cm left renal mass with no evidence of left renal vein involvement. C.T. Chest: Reviewed Films. Reviewed Report. Discussed With Patient.     03/09/05  PSA  Total PSA 0.72     01/13/07 03/09/05  Hormones  Testosterone, Total 1.88  1.99     PROCEDURES:          Urinalysis w/Scope - 81001 Dipstick Dipstick Cont'd Micro  Specimen: Voided Bilirubin: Neg WBC/hpf: NS (Not Seen)  Color: Yellow Ketones: Neg RBC/hpf: 0-2/hpf  Appearance: Clear Blood: Trace Intact Bacteria: NS (Not Seen)  Specific Gravity: 1.025 Protein: Neg Cystals: NS (Not Seen)  pH: 5.0 Urobilinogen: 0.2 Casts: NS (Not Seen)  Glucose: 3+ Nitrites: Neg Trichomonas: Not Present    Leukocyte Esterase: Neg Mucous: Not Present      Epithelial Cells: NS (Not Seen)      Yeast: NS (Not Seen)       Sperm: Not Present    ASSESSMENT:      ICD-10 Details  1 GU:   Benign Neo Kidney, 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   PLAN:           Schedule Return Visit: ASAP - Schedule Surgery          Document Letter(s):  Created for Patient: Clinical Summary    I detailed the various treatment options with the patient and ultimately I recommended a laparoscopic radical nephrectomy. I went over this operation in detail with the patient including the risks and benefits. We discussed port position and I outlined the position of the 4 planned trocars. I also explained to them that they will need extraction incision which typically is in the lower quadrant, connecting to the two lower lateral incisions. I discussed the actual surgery with them and we went over the various structures that are in intimate association with the kidney and the risk of damage thereof. I outlined the risk of injury to the major nerves and vessels in proximity to the kidney. I explained the patient the expected  hospital course. I told them that they should plan to be in the hospital at least 2-3 days. Further, I told them that they would likely need approximately 1 month to fully recover. Given the severity of this planned operation, we will have the patient be seen by their primary care doctor and cleared for surgery. We will plan to schedule this as soon as possible.

## 2015-10-11 NOTE — Discharge Instructions (Signed)

## 2015-10-12 LAB — GLUCOSE, CAPILLARY
Glucose-Capillary: 161 mg/dL — ABNORMAL HIGH (ref 65–99)
Glucose-Capillary: 173 mg/dL — ABNORMAL HIGH (ref 65–99)

## 2015-10-12 LAB — BASIC METABOLIC PANEL
Anion gap: 7 (ref 5–15)
BUN: 15 mg/dL (ref 6–20)
CALCIUM: 8.5 mg/dL — AB (ref 8.9–10.3)
CO2: 26 mmol/L (ref 22–32)
Chloride: 100 mmol/L — ABNORMAL LOW (ref 101–111)
Creatinine, Ser: 1.51 mg/dL — ABNORMAL HIGH (ref 0.61–1.24)
GFR calc Af Amer: 56 mL/min — ABNORMAL LOW (ref >60)
GFR, EST NON AFRICAN AMERICAN: 48 mL/min — AB (ref >60)
GLUCOSE: 181 mg/dL — AB (ref 65–99)
Potassium: 4.9 mmol/L (ref 3.5–5.1)
Sodium: 133 mmol/L — ABNORMAL LOW (ref 135–145)

## 2015-10-12 LAB — HEMOGLOBIN AND HEMATOCRIT, BLOOD
HCT: 27 % — ABNORMAL LOW (ref 39.0–52.0)
Hemoglobin: 8.4 g/dL — ABNORMAL LOW (ref 13.0–17.0)

## 2015-10-12 MED ORDER — ACETAMINOPHEN 10 MG/ML IV SOLN
1000.0000 mg | Freq: Four times a day (QID) | INTRAVENOUS | Status: DC
  Administered 2015-10-12: 1000 mg via INTRAVENOUS
  Filled 2015-10-12 (×2): qty 100

## 2015-10-12 MED ORDER — DOCUSATE SODIUM 100 MG PO CAPS: 100.0000 mg | ORAL_CAPSULE | Freq: Two times a day (BID) | ORAL | 0 refills | Status: DC | PRN

## 2015-10-12 NOTE — Progress Notes (Signed)
Urology Inpatient Progress Report  LEFT RENAL MASS  Procedure(s): LEFT LAPAROSCOPIC RADICAL NEPHRECTOMY  1 Day Post-Op   Intv/Subj: No acute events overnight. Patient is without complaint. Pain well controlled. No nausea/vomiting. Has been up and walking.  Active Problems:   Renal mass  Current Facility-Administered Medications  Medication Dose Route Frequency Provider Last Rate Last Dose  . acetaminophen (OFIRMEV) IV 1,000 mg  1,000 mg Intravenous Q6H Ardis Hughs, MD      . canagliflozin [facility]) tablet 100 mg  100 mg Oral QAC breakfast Ardis Hughs, MD       And  . metFORMIN (GLUCOPHAGE-XR) 24 hr tablet 1,000 mg  1,000 mg Oral Q breakfast Ardis Hughs, MD      . diphenhydrAMINE (BENADRYL) injection 12.5-25 mg  12.5-25 mg Intravenous Q6H PRN Debbrah Alar, PA-C       Or  . diphenhydrAMINE (BENADRYL) 12.5 MG/5ML elixir 12.5-25 mg  12.5-25 mg Oral Q6H PRN Debbrah Alar, PA-C      . HYDROcodone-acetaminophen (NORCO/VICODIN) 5-325 MG per tablet 1-2 tablet  1-2 tablet Oral Q4H PRN Debbrah Alar, PA-C      . Morphine Sulfate (PF) SOLN 2-4 mg  2-4 mg Intravenous Q2H PRN Debbrah Alar, PA-C   2 mg at 10/12/15 0235  . ondansetron (ZOFRAN) injection 4 mg  4 mg Intravenous Q4H PRN Debbrah Alar, PA-C      . pantoprazole (PROTONIX) EC tablet 40 mg  40 mg Oral Daily Amanda Dancy, PA-C   40 mg at 10/11/15 1612     Objective: Vital: Vitals:   10/11/15 1147 10/11/15 2223 10/12/15 0200 10/12/15 0637  BP: 135/80 131/65 127/60 (!) 124/53  Pulse:  (!) 43 (!) 41 (!) 43  Resp: 16 16 16 18   Temp: 98.5 F (36.9 C) 97.7 F (36.5 C) 97.3 F (36.3 C) 97.5 F (36.4 C)  TempSrc: Oral Oral Oral Oral  SpO2: 99% 98% 98% 97%  Weight:      Height:       I/Os: I/O last 3 completed shifts: In: 1500 [I.V.:1500] Out: 2450 [Urine:2400; Blood:50]  Physical Exam:  General: Patient is in no apparent distress Lungs: Normal respiratory effort, chest expands symmetrically. GI:  Incisions are c/d/i.  The abdomen is soft and nontender without mass. Foley: Clear yellow urine  Ext: lower extremities symmetric  Lab Results:  Recent Labs  10/11/15 1053 10/12/15 0512  HGB 8.9* 8.4*  HCT 28.8* 27.0*    Recent Labs  10/12/15 0512  NA 133*  K 4.9  CL 100*  CO2 26  GLUCOSE 181*  BUN 15  CREATININE 1.51*  CALCIUM 8.5*   No results for input(s): LABPT, INR in the last 72 hours. No results for input(s): LABURIN in the last 72 hours. Results for orders placed or performed in visit on 09/05/15  Blood culture (routine single)     Status: None   Collection Time: 09/05/15 10:06 AM  Result Value Ref Range Status   Organism ID, Bacteria NO GROWTH 5 DAYS  Final    Comment: Culture results may be compromised due to inadequate volume of Blood received in culture bottles.   Blood culture (routine single)     Status: None   Collection Time: 09/05/15 10:07 AM  Result Value Ref Range Status   Organism ID, Bacteria NO GROWTH 5 DAYS  Final    Studies/Results: No results found.  Assessment: Procedure(s): LEFT LAPAROSCOPIC RADICAL NEPHRECTOMY, 1 Day Post-Op  doing well.  Plan: Hep-Lock IV fluid DC Foley  catheter Advance diet as tolerated Encourage ambulation Likely discharged this afternoon   Louis Meckel, MD Urology 10/12/2015, 10:48 AM

## 2015-10-12 NOTE — Discharge Summary (Signed)
Date of admission: 10/11/2015  Date of discharge: 10/12/2015  Admission diagnosis: left renal mass  Discharge diagnosis: same  Secondary diagnoses:  Patient Active Problem List   Diagnosis Date Noted  . Renal mass 10/11/2015  . OA (osteoarthritis) of knee 09/10/2014    History and Physical: For full details, please see admission history and physical. Briefly, Lance Scott is a 60 y.o. year old patient with 12cm left renal mass.   Hospital Course: Patient tolerated the procedure well.  He was then transferred to the floor after an uneventful PACU stay.  His hospital course was uncomplicated.  On POD#1 he had met discharge criteria: was eating a regular diet, was up and ambulating independently,  pain was well controlled, was voiding without a catheter, and was ready to for discharge.   Laboratory values:   Recent Labs  10/11/15 1053 10/12/15 0512  HGB 8.9* 8.4*  HCT 28.8* 27.0*    Recent Labs  10/12/15 0512  NA 133*  K 4.9  CL 100*  CO2 26  GLUCOSE 181*  BUN 15  CREATININE 1.51*  CALCIUM 8.5*   No results for input(s): LABPT, INR in the last 72 hours. No results for input(s): LABURIN in the last 72 hours. Results for orders placed or performed in visit on 09/05/15  Blood culture (routine single)     Status: None   Collection Time: 09/05/15 10:06 AM  Result Value Ref Range Status   Organism ID, Bacteria NO GROWTH 5 DAYS  Final    Comment: Culture results may be compromised due to inadequate volume of Blood received in culture bottles.   Blood culture (routine single)     Status: None   Collection Time: 09/05/15 10:07 AM  Result Value Ref Range Status   Organism ID, Bacteria NO GROWTH 5 DAYS  Final    Disposition: Home  Discharge instruction: The patient was instructed to be ambulatory but told to refrain from heavy lifting, strenuous activity, or driving.   Discharge medications:   Medication List    STOP taking these medications   cholecalciferol 1000  units tablet Commonly known as:  VITAMIN D   naproxen sodium 220 MG tablet Commonly known as:  ANAPROX   VITAMIN B COMPLEX PO     TAKE these medications   acetaminophen 500 MG tablet Commonly known as:  TYLENOL Take 1,000 mg by mouth every 6 (six) hours as needed for fever.   docusate sodium 100 MG capsule Commonly known as:  COLACE Take 1 capsule (100 mg total) by mouth 2 (two) times daily as needed (take to keep stool soft.).   HYDROcodone-acetaminophen 5-325 MG tablet Commonly known as:  NORCO Take 1-2 tablets by mouth every 6 (six) hours as needed for moderate pain.   pantoprazole 40 MG tablet Commonly known as:  PROTONIX Take 40 mg by mouth daily.   pantoprazole 20 MG tablet Commonly known as:  PROTONIX Take 20 mg by mouth 2 (two) times daily.   XIGDUO XR 05-998 MG Tb24 Generic drug:  Dapagliflozin-Metformin HCl ER Take 1 tablet by mouth daily at 12 noon.       Followup:  Follow-up Information    Ardis Hughs, MD On 10/24/2015.   Specialty:  Urology Why:  at 1:45 Contact information: [place]  [phone]

## 2015-10-14 ENCOUNTER — Telehealth: Payer: Self-pay | Admitting: *Deleted

## 2015-10-14 ENCOUNTER — Other Ambulatory Visit: Payer: Self-pay | Admitting: *Deleted

## 2015-10-14 DIAGNOSIS — R053 Chronic cough: Secondary | ICD-10-CM

## 2015-10-14 DIAGNOSIS — R05 Cough: Secondary | ICD-10-CM

## 2015-10-14 NOTE — Telephone Encounter (Signed)
Per note from Dr Baxter Flattery called the patient to advise that a referral has been placed for him to see pulmonology and to expect a call.

## 2015-10-14 NOTE — Telephone Encounter (Signed)
-----   Message from Carlyle Basques, MD sent at 10/10/2015  9:13 AM EDT ----- Can we refer him to [place] pulmonology for chronic cough, concern as it may related to renal tumor. Spoke with dr Lake Bells who felt appropriate referral

## 2015-10-16 ENCOUNTER — Telehealth: Payer: Self-pay | Admitting: Internal Medicine

## 2015-10-16 NOTE — Telephone Encounter (Signed)
I spoken to the patient this afternoon to see how he is doing posteroperatively POD #4 from lap nephrectomy. I have informed him that his path report shows renal cell carcinoma. He was inquiring to the staging, which I reported as T2. We discussed that he would still need to see oncology to determine if he would benefit from immunotherapy and further testing such as PET scan to see if he has metastatic disease. He reports that since his surgery, he no longer has nightsweats or fevers as well as his dry cough has resolved. He does suffer from constipation for which I recommended he continue colace, bisacodyl, and may consider using miralax if no BM today.  He expressed interest to seeing oncology at [facility] center. I have scheduled appointment for 9/21 @ 9am with Dr. 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 Hem/onc clinic [phone]. The ID clinic will fax over information to the cancer ctr -------------------------------------------------------------------  Specimen, including laterality: Left kidney Procedure: Radical nephrectomy Tumor focality: Focal Maximum tumor size (cm): 9 cm Macroscopic extent of tumor: Confined to kidney Histologic type: Clear cell RCC If sarcomatoid features give percentage: NA Fuhrman Nuclear Grade: 4 Margins (renal vein, ureter, soft tissue): Negative Renal vein invasion: Negative Microscopic tumor extension: Limited to kidney Adrenal gland: Present Lymph nodes: number examined 0 ; number positive NA TNM code: pT2a , pNx Non-neoplastic kidney: Mild

## 2015-10-17 ENCOUNTER — Ambulatory Visit: Payer: 59 | Admitting: Internal Medicine

## 2015-12-02 ENCOUNTER — Institutional Professional Consult (permissible substitution): Payer: 59 | Admitting: Internal Medicine

## 2016-02-27 DIAGNOSIS — Z471 Aftercare following joint replacement surgery: Secondary | ICD-10-CM | POA: Diagnosis not present

## 2016-02-27 DIAGNOSIS — M7632 Iliotibial band syndrome, left leg: Secondary | ICD-10-CM | POA: Diagnosis not present

## 2016-02-27 DIAGNOSIS — Z96652 Presence of left artificial knee joint: Secondary | ICD-10-CM | POA: Diagnosis not present

## 2016-05-14 DIAGNOSIS — C642 Malignant neoplasm of left kidney, except renal pelvis: Secondary | ICD-10-CM | POA: Diagnosis not present

## 2016-05-14 DIAGNOSIS — N189 Chronic kidney disease, unspecified: Secondary | ICD-10-CM | POA: Diagnosis not present

## 2016-05-14 DIAGNOSIS — R188 Other ascites: Secondary | ICD-10-CM | POA: Diagnosis not present

## 2016-05-14 DIAGNOSIS — Z905 Acquired absence of kidney: Secondary | ICD-10-CM | POA: Diagnosis not present

## 2016-05-15 DIAGNOSIS — Z96652 Presence of left artificial knee joint: Secondary | ICD-10-CM | POA: Diagnosis not present

## 2016-05-15 DIAGNOSIS — M7632 Iliotibial band syndrome, left leg: Secondary | ICD-10-CM | POA: Diagnosis not present

## 2016-05-15 DIAGNOSIS — Z471 Aftercare following joint replacement surgery: Secondary | ICD-10-CM | POA: Diagnosis not present

## 2016-06-03 DIAGNOSIS — J0100 Acute maxillary sinusitis, unspecified: Secondary | ICD-10-CM | POA: Diagnosis not present

## 2016-06-29 DIAGNOSIS — Z905 Acquired absence of kidney: Secondary | ICD-10-CM | POA: Diagnosis not present

## 2016-06-29 DIAGNOSIS — C649 Malignant neoplasm of unspecified kidney, except renal pelvis: Secondary | ICD-10-CM | POA: Diagnosis not present

## 2016-07-09 DIAGNOSIS — N183 Chronic kidney disease, stage 3 (moderate): Secondary | ICD-10-CM | POA: Diagnosis not present

## 2016-07-09 DIAGNOSIS — R030 Elevated blood-pressure reading, without diagnosis of hypertension: Secondary | ICD-10-CM | POA: Diagnosis not present

## 2016-07-09 DIAGNOSIS — D631 Anemia in chronic kidney disease: Secondary | ICD-10-CM | POA: Diagnosis not present

## 2016-07-13 DIAGNOSIS — N183 Chronic kidney disease, stage 3 (moderate): Secondary | ICD-10-CM | POA: Diagnosis not present

## 2016-11-12 DIAGNOSIS — C642 Malignant neoplasm of left kidney, except renal pelvis: Secondary | ICD-10-CM | POA: Diagnosis not present

## 2016-11-12 DIAGNOSIS — I129 Hypertensive chronic kidney disease with stage 1 through stage 4 chronic kidney disease, or unspecified chronic kidney disease: Secondary | ICD-10-CM | POA: Diagnosis not present

## 2016-11-12 DIAGNOSIS — R739 Hyperglycemia, unspecified: Secondary | ICD-10-CM | POA: Diagnosis not present

## 2016-11-12 DIAGNOSIS — Z23 Encounter for immunization: Secondary | ICD-10-CM | POA: Diagnosis not present

## 2016-11-12 DIAGNOSIS — N189 Chronic kidney disease, unspecified: Secondary | ICD-10-CM | POA: Diagnosis not present

## 2016-11-12 DIAGNOSIS — Z9889 Other specified postprocedural states: Secondary | ICD-10-CM | POA: Diagnosis not present

## 2016-11-16 DIAGNOSIS — E1122 Type 2 diabetes mellitus with diabetic chronic kidney disease: Secondary | ICD-10-CM | POA: Diagnosis not present

## 2016-11-16 DIAGNOSIS — N183 Chronic kidney disease, stage 3 (moderate): Secondary | ICD-10-CM | POA: Diagnosis not present

## 2016-11-16 DIAGNOSIS — Z85528 Personal history of other malignant neoplasm of kidney: Secondary | ICD-10-CM | POA: Diagnosis not present

## 2016-11-19 DIAGNOSIS — Z7982 Long term (current) use of aspirin: Secondary | ICD-10-CM | POA: Diagnosis not present

## 2016-11-19 DIAGNOSIS — I10 Essential (primary) hypertension: Secondary | ICD-10-CM | POA: Diagnosis not present

## 2016-11-19 DIAGNOSIS — D649 Anemia, unspecified: Secondary | ICD-10-CM | POA: Diagnosis not present

## 2016-11-24 DIAGNOSIS — N183 Chronic kidney disease, stage 3 (moderate): Secondary | ICD-10-CM | POA: Diagnosis not present

## 2016-11-24 DIAGNOSIS — I10 Essential (primary) hypertension: Secondary | ICD-10-CM | POA: Diagnosis not present

## 2016-12-04 DIAGNOSIS — N183 Chronic kidney disease, stage 3 (moderate): Secondary | ICD-10-CM | POA: Diagnosis not present

## 2016-12-04 DIAGNOSIS — I10 Essential (primary) hypertension: Secondary | ICD-10-CM | POA: Diagnosis not present

## 2016-12-16 DIAGNOSIS — I10 Essential (primary) hypertension: Secondary | ICD-10-CM | POA: Diagnosis not present

## 2016-12-25 DIAGNOSIS — E1122 Type 2 diabetes mellitus with diabetic chronic kidney disease: Secondary | ICD-10-CM | POA: Diagnosis not present

## 2016-12-25 DIAGNOSIS — I10 Essential (primary) hypertension: Secondary | ICD-10-CM | POA: Diagnosis not present

## 2016-12-28 DIAGNOSIS — J0100 Acute maxillary sinusitis, unspecified: Secondary | ICD-10-CM | POA: Diagnosis not present

## 2016-12-30 DIAGNOSIS — L239 Allergic contact dermatitis, unspecified cause: Secondary | ICD-10-CM | POA: Diagnosis not present

## 2017-01-06 DIAGNOSIS — R21 Rash and other nonspecific skin eruption: Secondary | ICD-10-CM | POA: Diagnosis not present

## 2017-01-06 DIAGNOSIS — R05 Cough: Secondary | ICD-10-CM | POA: Diagnosis not present

## 2017-01-15 DIAGNOSIS — I10 Essential (primary) hypertension: Secondary | ICD-10-CM | POA: Diagnosis not present

## 2017-01-15 DIAGNOSIS — J0100 Acute maxillary sinusitis, unspecified: Secondary | ICD-10-CM | POA: Diagnosis not present

## 2017-04-08 DIAGNOSIS — Z85528 Personal history of other malignant neoplasm of kidney: Secondary | ICD-10-CM | POA: Diagnosis not present

## 2017-04-08 DIAGNOSIS — I10 Essential (primary) hypertension: Secondary | ICD-10-CM | POA: Diagnosis not present

## 2017-04-08 DIAGNOSIS — E1122 Type 2 diabetes mellitus with diabetic chronic kidney disease: Secondary | ICD-10-CM | POA: Diagnosis not present

## 2017-04-08 DIAGNOSIS — N183 Chronic kidney disease, stage 3 (moderate): Secondary | ICD-10-CM | POA: Diagnosis not present

## 2017-05-13 DIAGNOSIS — Z905 Acquired absence of kidney: Secondary | ICD-10-CM | POA: Diagnosis not present

## 2017-05-13 DIAGNOSIS — I10 Essential (primary) hypertension: Secondary | ICD-10-CM | POA: Diagnosis not present

## 2017-05-13 DIAGNOSIS — E119 Type 2 diabetes mellitus without complications: Secondary | ICD-10-CM | POA: Diagnosis not present

## 2017-05-13 DIAGNOSIS — C642 Malignant neoplasm of left kidney, except renal pelvis: Secondary | ICD-10-CM | POA: Diagnosis not present

## 2017-06-16 DIAGNOSIS — J019 Acute sinusitis, unspecified: Secondary | ICD-10-CM | POA: Diagnosis not present

## 2017-07-05 DIAGNOSIS — Z0000 Encounter for general adult medical examination without abnormal findings: Secondary | ICD-10-CM | POA: Diagnosis not present

## 2017-07-05 DIAGNOSIS — E291 Testicular hypofunction: Secondary | ICD-10-CM | POA: Diagnosis not present

## 2017-07-05 DIAGNOSIS — Z7902 Long term (current) use of antithrombotics/antiplatelets: Secondary | ICD-10-CM | POA: Diagnosis not present

## 2017-07-07 DIAGNOSIS — Z0000 Encounter for general adult medical examination without abnormal findings: Secondary | ICD-10-CM | POA: Diagnosis not present

## 2017-07-07 DIAGNOSIS — Z1212 Encounter for screening for malignant neoplasm of rectum: Secondary | ICD-10-CM | POA: Diagnosis not present

## 2017-07-07 DIAGNOSIS — L570 Actinic keratosis: Secondary | ICD-10-CM | POA: Diagnosis not present

## 2017-07-20 DIAGNOSIS — Z8639 Personal history of other endocrine, nutritional and metabolic disease: Secondary | ICD-10-CM | POA: Diagnosis not present

## 2017-07-20 DIAGNOSIS — N183 Chronic kidney disease, stage 3 (moderate): Secondary | ICD-10-CM | POA: Diagnosis not present

## 2017-07-20 DIAGNOSIS — E119 Type 2 diabetes mellitus without complications: Secondary | ICD-10-CM | POA: Diagnosis not present

## 2017-08-18 DIAGNOSIS — E7800 Pure hypercholesterolemia, unspecified: Secondary | ICD-10-CM | POA: Diagnosis not present

## 2017-08-31 DIAGNOSIS — Z8639 Personal history of other endocrine, nutritional and metabolic disease: Secondary | ICD-10-CM | POA: Diagnosis not present

## 2017-08-31 DIAGNOSIS — N183 Chronic kidney disease, stage 3 (moderate): Secondary | ICD-10-CM | POA: Diagnosis not present

## 2017-08-31 DIAGNOSIS — E119 Type 2 diabetes mellitus without complications: Secondary | ICD-10-CM | POA: Diagnosis not present

## 2017-09-07 ENCOUNTER — Ambulatory Visit: Payer: 59

## 2017-09-14 ENCOUNTER — Ambulatory Visit: Payer: 59

## 2017-09-21 ENCOUNTER — Ambulatory Visit: Payer: 59

## 2017-10-28 DIAGNOSIS — Z7984 Long term (current) use of oral hypoglycemic drugs: Secondary | ICD-10-CM | POA: Diagnosis not present

## 2017-10-28 DIAGNOSIS — I10 Essential (primary) hypertension: Secondary | ICD-10-CM | POA: Diagnosis not present

## 2017-10-28 DIAGNOSIS — N183 Chronic kidney disease, stage 3 (moderate): Secondary | ICD-10-CM | POA: Diagnosis not present

## 2017-10-28 DIAGNOSIS — I129 Hypertensive chronic kidney disease with stage 1 through stage 4 chronic kidney disease, or unspecified chronic kidney disease: Secondary | ICD-10-CM | POA: Diagnosis not present

## 2017-10-28 DIAGNOSIS — D631 Anemia in chronic kidney disease: Secondary | ICD-10-CM | POA: Diagnosis not present

## 2017-10-28 DIAGNOSIS — E1122 Type 2 diabetes mellitus with diabetic chronic kidney disease: Secondary | ICD-10-CM | POA: Diagnosis not present

## 2017-11-09 DIAGNOSIS — N183 Chronic kidney disease, stage 3 (moderate): Secondary | ICD-10-CM | POA: Diagnosis not present

## 2017-11-09 DIAGNOSIS — E119 Type 2 diabetes mellitus without complications: Secondary | ICD-10-CM | POA: Diagnosis not present

## 2017-11-09 DIAGNOSIS — Z8639 Personal history of other endocrine, nutritional and metabolic disease: Secondary | ICD-10-CM | POA: Diagnosis not present

## 2017-11-11 DIAGNOSIS — I129 Hypertensive chronic kidney disease with stage 1 through stage 4 chronic kidney disease, or unspecified chronic kidney disease: Secondary | ICD-10-CM | POA: Diagnosis not present

## 2017-11-11 DIAGNOSIS — K8020 Calculus of gallbladder without cholecystitis without obstruction: Secondary | ICD-10-CM | POA: Diagnosis not present

## 2017-11-11 DIAGNOSIS — N189 Chronic kidney disease, unspecified: Secondary | ICD-10-CM | POA: Diagnosis not present

## 2017-11-11 DIAGNOSIS — C642 Malignant neoplasm of left kidney, except renal pelvis: Secondary | ICD-10-CM | POA: Diagnosis not present

## 2017-11-23 DIAGNOSIS — H02831 Dermatochalasis of right upper eyelid: Secondary | ICD-10-CM | POA: Diagnosis not present

## 2017-11-23 DIAGNOSIS — D3131 Benign neoplasm of right choroid: Secondary | ICD-10-CM | POA: Diagnosis not present

## 2017-11-23 DIAGNOSIS — Z8639 Personal history of other endocrine, nutritional and metabolic disease: Secondary | ICD-10-CM | POA: Diagnosis not present

## 2017-11-23 DIAGNOSIS — E119 Type 2 diabetes mellitus without complications: Secondary | ICD-10-CM | POA: Diagnosis not present

## 2017-11-23 DIAGNOSIS — R7303 Prediabetes: Secondary | ICD-10-CM | POA: Diagnosis not present

## 2017-11-23 DIAGNOSIS — N183 Chronic kidney disease, stage 3 (moderate): Secondary | ICD-10-CM | POA: Diagnosis not present

## 2017-12-06 IMAGING — CR DG CHEST 2V
2 series · 2 of 2 positions shown · non-contrast
Comparison: 03/06/2015 .

CLINICAL DATA: Chronic cough

EXAM:
CHEST  2 VIEW

[view not recorded (1 of 2)]
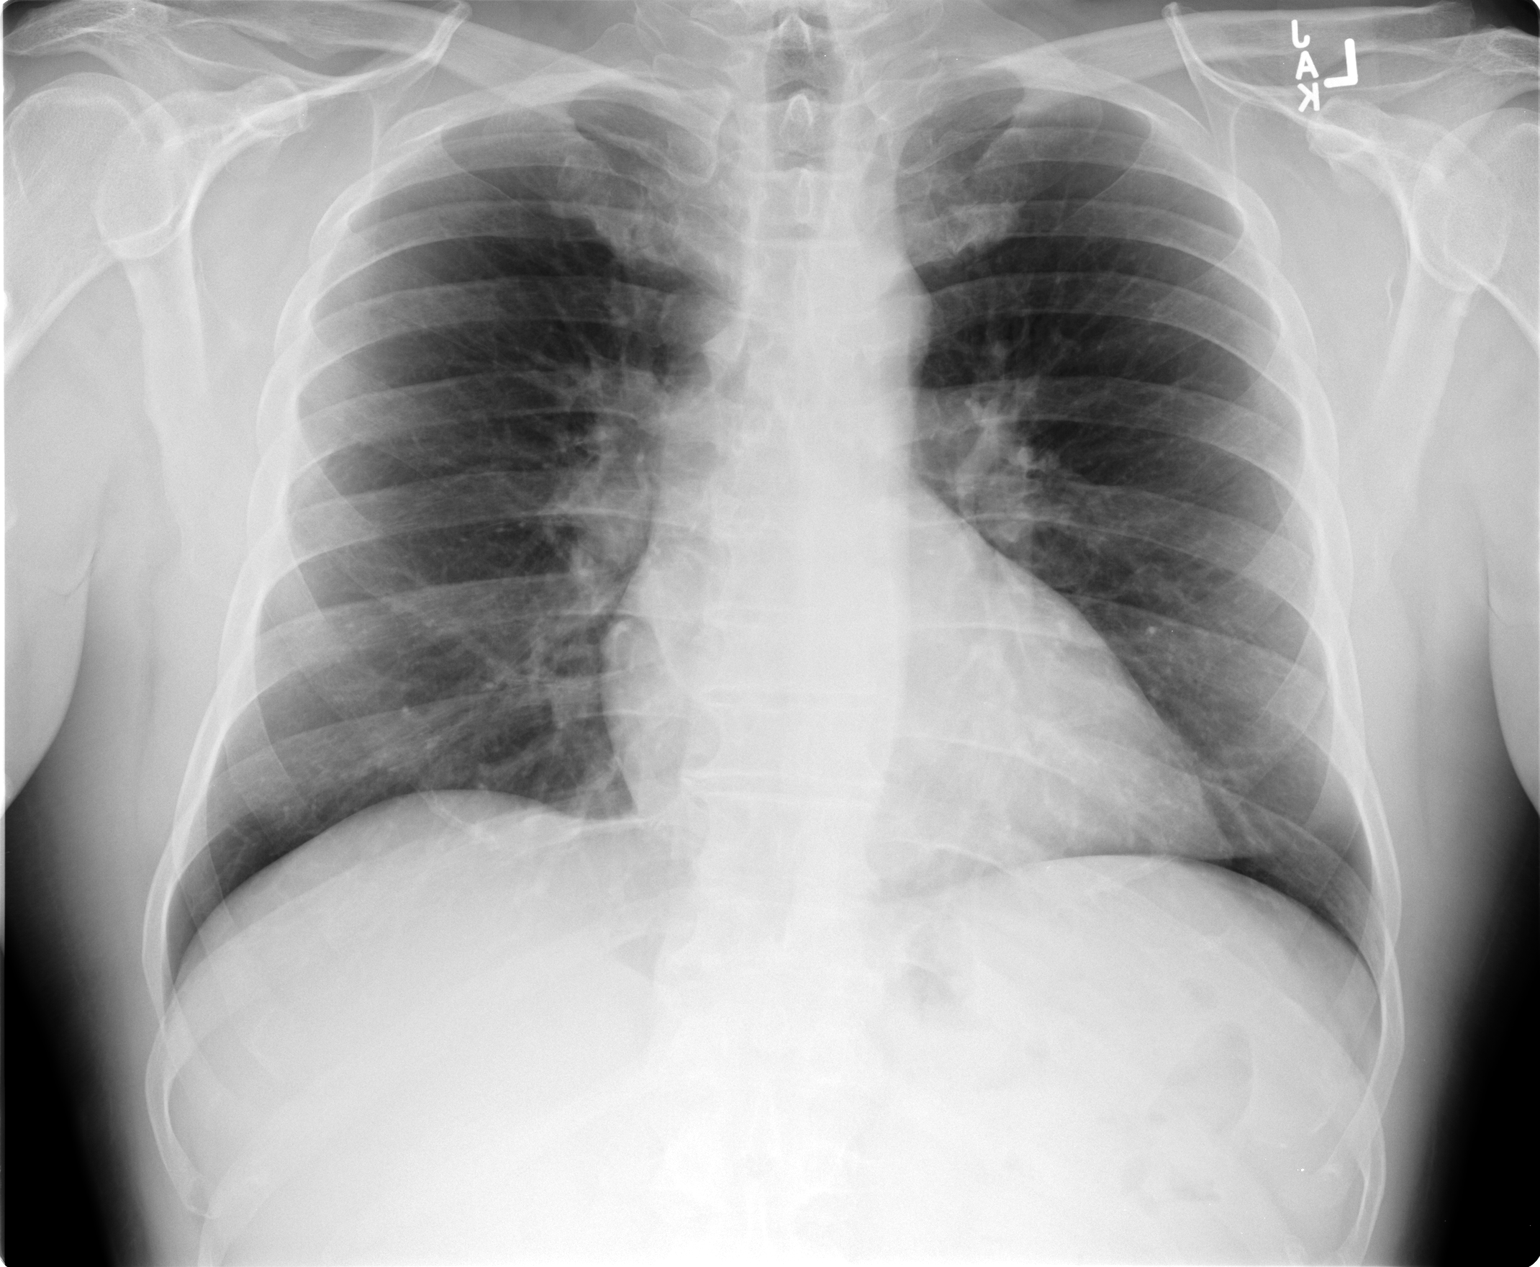

[view not recorded (2 of 2)]
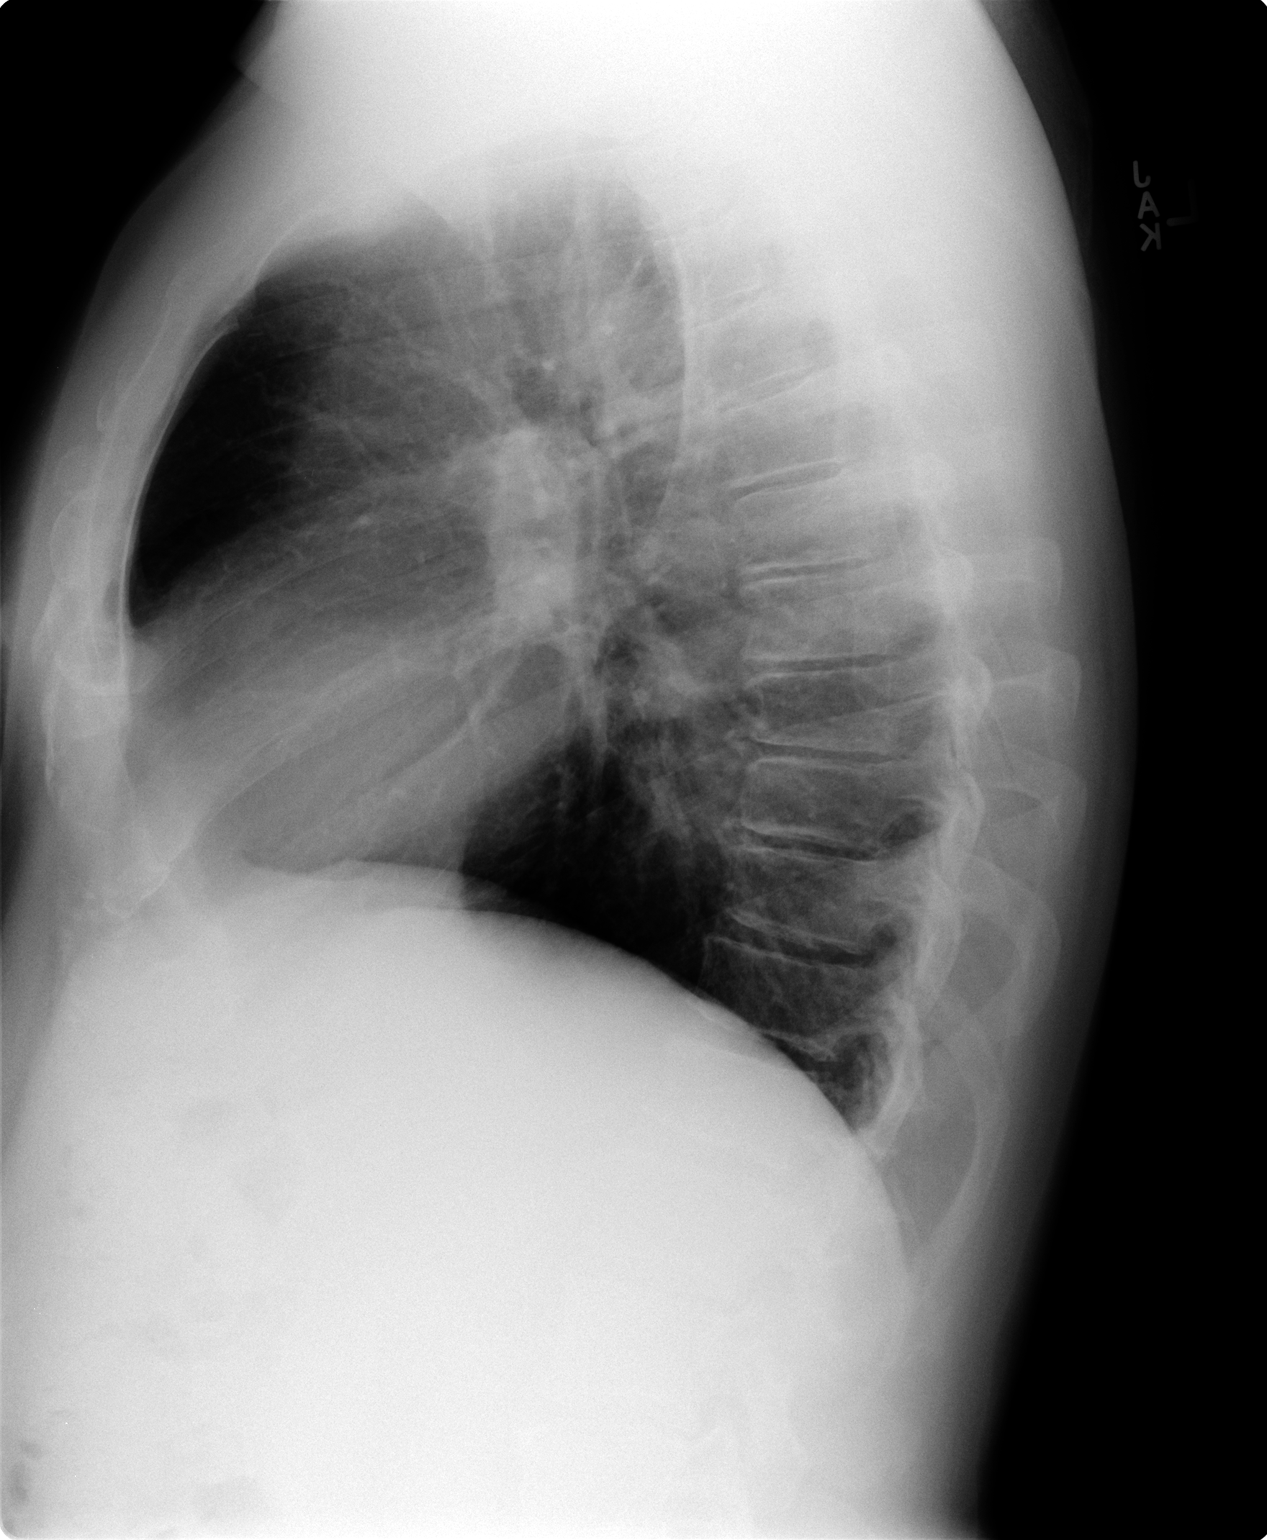

[2 of 2 positions shown; findings below may reference images not displayed]

FINDINGS: Mediastinum hilar structures normal. Mild cardiomegaly with normal
pulmonary vascularity. No focal infiltrate. No pleural effusion or
pneumothorax.
IMPRESSION: No acute abnormality.

## 2017-12-27 DIAGNOSIS — J019 Acute sinusitis, unspecified: Secondary | ICD-10-CM | POA: Diagnosis not present

## 2018-05-05 DIAGNOSIS — E1122 Type 2 diabetes mellitus with diabetic chronic kidney disease: Secondary | ICD-10-CM | POA: Diagnosis not present

## 2018-05-05 DIAGNOSIS — I129 Hypertensive chronic kidney disease with stage 1 through stage 4 chronic kidney disease, or unspecified chronic kidney disease: Secondary | ICD-10-CM | POA: Diagnosis not present

## 2018-05-05 DIAGNOSIS — R911 Solitary pulmonary nodule: Secondary | ICD-10-CM | POA: Diagnosis not present

## 2018-05-05 DIAGNOSIS — Z905 Acquired absence of kidney: Secondary | ICD-10-CM | POA: Diagnosis not present

## 2018-05-05 DIAGNOSIS — C642 Malignant neoplasm of left kidney, except renal pelvis: Secondary | ICD-10-CM | POA: Diagnosis not present

## 2018-08-03 ENCOUNTER — Other Ambulatory Visit: Payer: Self-pay | Admitting: Gastroenterology

## 2018-08-03 DIAGNOSIS — R1310 Dysphagia, unspecified: Secondary | ICD-10-CM

## 2018-08-09 ENCOUNTER — Ambulatory Visit
Admission: RE | Admit: 2018-08-09 | Discharge: 2018-08-09 | Disposition: A | Discharge status: Home / Self Care | Payer: 59 | Source: Ambulatory Visit | Attending: Gastroenterology | Admitting: Gastroenterology

## 2018-08-09 DIAGNOSIS — R1310 Dysphagia, unspecified: Secondary | ICD-10-CM

## 2019-09-25 DIAGNOSIS — I2510 Atherosclerotic heart disease of native coronary artery without angina pectoris: Secondary | ICD-10-CM | POA: Diagnosis not present

## 2019-09-26 DIAGNOSIS — M8589 Other specified disorders of bone density and structure, multiple sites: Secondary | ICD-10-CM | POA: Diagnosis not present

## 2019-09-26 DIAGNOSIS — I2510 Atherosclerotic heart disease of native coronary artery without angina pectoris: Secondary | ICD-10-CM | POA: Diagnosis not present

## 2019-09-26 DIAGNOSIS — E7800 Pure hypercholesterolemia, unspecified: Secondary | ICD-10-CM | POA: Diagnosis not present

## 2019-09-26 DIAGNOSIS — I10 Essential (primary) hypertension: Secondary | ICD-10-CM | POA: Diagnosis not present

## 2019-09-26 DIAGNOSIS — E1122 Type 2 diabetes mellitus with diabetic chronic kidney disease: Secondary | ICD-10-CM | POA: Diagnosis not present

## 2019-10-06 NOTE — Progress Notes (Signed)
Patient referred by [name], MD for elevated calcium score  Subjective:   Lance Scott, male    DOB: 04/13/1955, 64 y.o.   MRN: [id number]   Chief Complaint  Patient presents with  . Coronary Artery Disease  . New Patient (Initial Visit)     HPI  64 y.o. Caucasian male with hypertension, hyperlipidemia, prediabetes, s/p nephrectomy, elevated calcium score.  Patient recently underwent calcium score scan for stratification.  This showed elevated calcium score of 209, distributed across all 3 vessels, but none in left main.  Patient is retired Retail buyer, still helps with certain supervisory roles.  He is fairly active, walks up to 5 miles a day.  He stays active in the yard, is currently building a tree house. He denies chest pain, shortness of breath, palpitations, leg edema, orthopnea, PND, TIA/syncope.  He has history of kidney cancer, underwent nephrectomy.  Therefore, he has solitary kidney.  GFR is 40s-50s   Past Medical History:  Diagnosis Date  . Androgen deficiency   . Anemia   . Arthritis    left knee  . Complication of anesthesia   . Diabetes mellitus without complication ([place])   . Dysphagia   . ED (erectile dysfunction)   . Effusion of left knee   . Fever, unspecified   . Gallstones   . GERD (gastroesophageal reflux disease)   . History of bronchitis   . History of esophageal stricture   . History of hiatal hernia   . History of lacunar cerebrovascular accident   . Hypercholesteremia   . Left renal mass   . Obesity   . Pharyngitis   . Pneumonia    last episode 04/2015  . PONV (postoperative nausea and vomiting)   . Pre-diabetes   . RLS (restless legs syndrome)      Past Surgical History:  Procedure Laterality Date  . KNEE ARTHROSCOPY  1991 / 2003   x2 left knee  . LAPAROSCOPIC NEPHRECTOMY Left 10/11/2015   Procedure: LEFT LAPAROSCOPIC RADICAL NEPHRECTOMY;  Surgeon: Ardis Hughs, MD;  Location: [facility];  Service: Urology;   Laterality: Left;  . lasix eye surgery     bilateral  . NASAL SEPTUM SURGERY    . TOTAL KNEE ARTHROPLASTY Left 09/10/2014   Procedure: LEFT TOTAL KNEE ARTHROPLASTY;  Surgeon: Gaynelle Arabian, MD;  Location: [facility];  Service: Orthopedics;  Laterality: Left;     Social History   Tobacco Use  Smoking Status Never Smoker  Smokeless Tobacco Never Used    Social History   Substance and Sexual Activity  Alcohol Use No     Family History  Problem Relation Age of Onset  . Hypertension Mother   . Diabetes Father   . Diabetes Sister   . Diabetes Brother   . Diabetes Brother   . Heart disease Brother   . Heart attack Brother      Current Outpatient Medications on File Prior to Visit  Medication Sig Dispense Refill  . rosuvastatin (CRESTOR) 20 MG tablet Take 1 tablet by mouth daily.     Marland Kitchen telmisartan (MICARDIS) 80 MG tablet Take 1 tablet by mouth daily.    Marland Kitchen testosterone cypionate (DEPOTESTOSTERONE CYPIONATE) 200 MG/ML injection Inject 200 mg into the muscle every 14 (fourteen) days.    Marland Kitchen XIGDUO XR 05-998 MG TB24 Take 1 tablet by mouth daily at 12 noon.      No current facility-administered medications on file prior to visit.    Cardiovascular and other  pertinent studies:  EKG 10/09/2019: Sinus rhythm 61 bpm Normal EKG  CT Cardiac Calcium Scoring 08/08/2019:  1. Total calcium score: 209 2. Left Main: 0 3. Left Anterior Descending: 50 4.Circumflex: 60 5. Rt Coronary: 159 6. Cardiovascular: NL heart size: no Pericardial effusion    Recent labs: 07/2019: Glucose 137, BUN/Cr 16/1.53. EGFR 55. Na/K 136/4.6.  H/H 15.1/45.2 MCV 84.5. Platelets 213 Chol 126, TG 104, HDL 35, LDL 72    Review of Systems  Cardiovascular: Negative for chest pain, dyspnea on exertion, leg swelling, palpitations and syncope.         Vitals:   10/09/19 0850  BP: 134/86  Pulse: 70  Resp: 16  SpO2: 96%     Body mass index is 31.17 kg/m. Filed Weights   10/09/19 0850  Weight: 199  lb (90.3 kg)     Objective:   Physical Exam Vitals and nursing note reviewed.  Constitutional:      General: He is not in acute distress. Neck:     Vascular: No JVD.  Cardiovascular:     Rate and Rhythm: Normal rate and regular rhythm.     Heart sounds: Normal heart sounds. No murmur heard.   Pulmonary:     Effort: Pulmonary effort is normal.     Breath sounds: Normal breath sounds. No wheezing or rales.          Assessment & Recommendations:   64 y.o. Caucasian male with hypertension, hyperlipidemia, prediabetes, s/p nephrectomy, elevated calcium score.  Elevated calcium score: 209, distributed across LAD, left circumflex, RCA.  No calcium seen in left main.  He is completely asymptomatic with fairly good functional capacity.  He has 1 kidney, owing to prior nephrectomy performed for cancer.  Recommend conservative medical management. In absence of bleeding, recommend aspirin 81 mg daily.  Continue Crestor 20 mg daily. Discussed diet and lifestyle modifications to reduce carbohydrate intake, increase omega-3 fatty acid intake, reduce intake of red meat and processed food.  Hypertension: Controlled  Hyperlipidemia: Continue Crestor 20 mg  Follow-up in 1 year.  Should he develop any symptoms of exertional chest pain or dyspnea, he knows to contact me sooner.   Thank you for referring the patient to [place]. Please feel free to contact with any questions.   Nigel Mormon, MD Pager: [phone] Office: [phone]

## 2019-10-09 ENCOUNTER — Ambulatory Visit: Payer: Self-pay | Admitting: Cardiology

## 2019-10-09 ENCOUNTER — Ambulatory Visit: Payer: Medicare Other | Admitting: Cardiology

## 2019-10-09 ENCOUNTER — Other Ambulatory Visit: Payer: Self-pay

## 2019-10-09 ENCOUNTER — Encounter: Payer: Self-pay | Admitting: Cardiology

## 2019-10-09 VITALS — BP 134/86 | HR 70 | Resp 16 | Ht 67.0 in | Wt 199.0 lb

## 2019-10-09 DIAGNOSIS — R931 Abnormal findings on diagnostic imaging of heart and coronary circulation: Secondary | ICD-10-CM | POA: Diagnosis not present

## 2019-10-09 DIAGNOSIS — Z905 Acquired absence of kidney: Secondary | ICD-10-CM | POA: Insufficient documentation

## 2019-10-09 DIAGNOSIS — I10 Essential (primary) hypertension: Secondary | ICD-10-CM

## 2019-10-09 DIAGNOSIS — E782 Mixed hyperlipidemia: Secondary | ICD-10-CM | POA: Diagnosis not present

## 2019-10-09 MED ORDER — ASPIRIN EC 81 MG PO TBEC: 81.0000 mg | DELAYED_RELEASE_TABLET | Freq: Every day | ORAL | 3 refills | Status: DC

## 2019-10-09 NOTE — Patient Instructions (Signed)
Diet & Lifestyle recommendations:  Physical activity recommendation (The Physical Activity Guidelines for Americans. JAMA 2018;Nov 12) At least 150-300 minutes a week of moderate-intensity, or 75-150 minutes a week of vigorous-intensity aerobic physical activity, or an equivalent combination of moderate- and vigorous-intensity aerobic activity. Adults should perform muscle-strengthening activities on 2 or more days a week. Older adults should do multicomponent physical activity that includes balance training as well as aerobic and muscle-strengthening activities. Benefits of increased physical activity include lower risk of mortality including cardiovascular mortality, lower risk of cardiovascular events and associated risk factors (hypertension and diabetes), and lower risk of many cancers (including bladder, breast, colon, endometrium, esophagus, kidney, lung, and stomach). Additional improvments have been seen in cognition, risk of dementia, anxiety and depression, improved bone health, lower risk of falls, and associated injuries.  Dietary recommendation The 2019 ACC/AHA guidelines promote nutrition as a main fixture of cardiovascular wellness, with a recommendation for a varied diet of fruit, vegetables, fish, legumes, and whole grains (Class I), as well as recommendations to reduce sodium, cholesterol, processed meats, and refined sugars (Class IIa recommendation).10 Sodium intake, a topic of some controversy as of late, is recommended to be kept at 1,500 mg/day or less, far below the average daily intake in the [place] of 3,409 mg/day, and notably below that of previous [place] recommendations for 300mg /day.10,11 For those unable to reach 1,500 mg/day, they recommend at least a reduction of 1000 mg/day.  A Pesco-Mediterranean Diet With Intermittent Fasting: JACC Review Topic of the Week. J Am Coll Cardiol 5974;16:3845-3646 Pesco-Mediterranean diet, it is supplemented with extra-virgin olive oil (EVOO),  which is the principle fat source, along with moderate amounts of dairy (particularly yogurt and cheese) and eggs, as well as modest amounts of alcohol consumption (ideally red wine with the evening meal), but few red and processed meats.

## 2019-11-09 DIAGNOSIS — C642 Malignant neoplasm of left kidney, except renal pelvis: Secondary | ICD-10-CM | POA: Diagnosis not present

## 2019-11-09 DIAGNOSIS — Z23 Encounter for immunization: Secondary | ICD-10-CM | POA: Diagnosis not present

## 2019-11-09 DIAGNOSIS — D509 Iron deficiency anemia, unspecified: Secondary | ICD-10-CM | POA: Diagnosis not present

## 2019-11-09 DIAGNOSIS — K8020 Calculus of gallbladder without cholecystitis without obstruction: Secondary | ICD-10-CM | POA: Diagnosis not present

## 2019-11-09 DIAGNOSIS — R918 Other nonspecific abnormal finding of lung field: Secondary | ICD-10-CM | POA: Diagnosis not present

## 2019-11-30 DIAGNOSIS — H2513 Age-related nuclear cataract, bilateral: Secondary | ICD-10-CM | POA: Diagnosis not present

## 2019-11-30 DIAGNOSIS — H04123 Dry eye syndrome of bilateral lacrimal glands: Secondary | ICD-10-CM | POA: Diagnosis not present

## 2019-11-30 DIAGNOSIS — H02831 Dermatochalasis of right upper eyelid: Secondary | ICD-10-CM | POA: Diagnosis not present

## 2019-11-30 DIAGNOSIS — H02834 Dermatochalasis of left upper eyelid: Secondary | ICD-10-CM | POA: Diagnosis not present

## 2019-11-30 DIAGNOSIS — R7309 Other abnormal glucose: Secondary | ICD-10-CM | POA: Diagnosis not present

## 2020-01-31 DIAGNOSIS — M25511 Pain in right shoulder: Secondary | ICD-10-CM | POA: Diagnosis not present

## 2020-03-20 DIAGNOSIS — M25511 Pain in right shoulder: Secondary | ICD-10-CM | POA: Diagnosis not present

## 2020-03-25 DIAGNOSIS — M791 Myalgia, unspecified site: Secondary | ICD-10-CM | POA: Diagnosis not present

## 2020-04-23 DIAGNOSIS — M791 Myalgia, unspecified site: Secondary | ICD-10-CM | POA: Diagnosis not present

## 2020-04-23 DIAGNOSIS — E349 Endocrine disorder, unspecified: Secondary | ICD-10-CM | POA: Diagnosis not present

## 2020-04-23 DIAGNOSIS — I10 Essential (primary) hypertension: Secondary | ICD-10-CM | POA: Diagnosis not present

## 2020-04-23 DIAGNOSIS — T466X5A Adverse effect of antihyperlipidemic and antiarteriosclerotic drugs, initial encounter: Secondary | ICD-10-CM | POA: Diagnosis not present

## 2020-04-23 DIAGNOSIS — I2510 Atherosclerotic heart disease of native coronary artery without angina pectoris: Secondary | ICD-10-CM | POA: Diagnosis not present

## 2020-04-23 DIAGNOSIS — E1122 Type 2 diabetes mellitus with diabetic chronic kidney disease: Secondary | ICD-10-CM | POA: Diagnosis not present

## 2020-04-24 DIAGNOSIS — M7541 Impingement syndrome of right shoulder: Secondary | ICD-10-CM | POA: Diagnosis not present

## 2020-05-10 DIAGNOSIS — R52 Pain, unspecified: Secondary | ICD-10-CM | POA: Diagnosis not present

## 2020-05-10 DIAGNOSIS — R059 Cough, unspecified: Secondary | ICD-10-CM | POA: Diagnosis not present

## 2020-05-10 DIAGNOSIS — R6883 Chills (without fever): Secondary | ICD-10-CM | POA: Diagnosis not present

## 2020-05-10 DIAGNOSIS — R509 Fever, unspecified: Secondary | ICD-10-CM | POA: Diagnosis not present

## 2020-05-31 DIAGNOSIS — R053 Chronic cough: Secondary | ICD-10-CM | POA: Diagnosis not present

## 2020-06-04 DIAGNOSIS — I2510 Atherosclerotic heart disease of native coronary artery without angina pectoris: Secondary | ICD-10-CM | POA: Diagnosis not present

## 2020-06-04 DIAGNOSIS — I10 Essential (primary) hypertension: Secondary | ICD-10-CM | POA: Diagnosis not present

## 2020-07-19 DIAGNOSIS — R39198 Other difficulties with micturition: Secondary | ICD-10-CM | POA: Diagnosis not present

## 2020-07-19 DIAGNOSIS — I10 Essential (primary) hypertension: Secondary | ICD-10-CM | POA: Diagnosis not present

## 2020-07-19 DIAGNOSIS — E1122 Type 2 diabetes mellitus with diabetic chronic kidney disease: Secondary | ICD-10-CM | POA: Diagnosis not present

## 2020-07-19 DIAGNOSIS — E349 Endocrine disorder, unspecified: Secondary | ICD-10-CM | POA: Diagnosis not present

## 2020-07-19 DIAGNOSIS — E7800 Pure hypercholesterolemia, unspecified: Secondary | ICD-10-CM | POA: Diagnosis not present

## 2020-07-25 DIAGNOSIS — E1122 Type 2 diabetes mellitus with diabetic chronic kidney disease: Secondary | ICD-10-CM | POA: Diagnosis not present

## 2020-07-25 DIAGNOSIS — I2510 Atherosclerotic heart disease of native coronary artery without angina pectoris: Secondary | ICD-10-CM | POA: Diagnosis not present

## 2020-07-25 DIAGNOSIS — I10 Essential (primary) hypertension: Secondary | ICD-10-CM | POA: Diagnosis not present

## 2020-07-25 DIAGNOSIS — E349 Endocrine disorder, unspecified: Secondary | ICD-10-CM | POA: Diagnosis not present

## 2020-07-25 DIAGNOSIS — T466X5A Adverse effect of antihyperlipidemic and antiarteriosclerotic drugs, initial encounter: Secondary | ICD-10-CM | POA: Diagnosis not present

## 2020-07-25 DIAGNOSIS — M791 Myalgia, unspecified site: Secondary | ICD-10-CM | POA: Diagnosis not present

## 2020-07-26 DIAGNOSIS — Z8673 Personal history of transient ischemic attack (TIA), and cerebral infarction without residual deficits: Secondary | ICD-10-CM | POA: Diagnosis not present

## 2020-07-26 DIAGNOSIS — N1831 Chronic kidney disease, stage 3a: Secondary | ICD-10-CM | POA: Diagnosis not present

## 2020-07-26 DIAGNOSIS — Z1212 Encounter for screening for malignant neoplasm of rectum: Secondary | ICD-10-CM | POA: Diagnosis not present

## 2020-07-26 DIAGNOSIS — Z0000 Encounter for general adult medical examination without abnormal findings: Secondary | ICD-10-CM | POA: Diagnosis not present

## 2020-07-26 DIAGNOSIS — E1122 Type 2 diabetes mellitus with diabetic chronic kidney disease: Secondary | ICD-10-CM | POA: Diagnosis not present

## 2020-07-26 DIAGNOSIS — I2510 Atherosclerotic heart disease of native coronary artery without angina pectoris: Secondary | ICD-10-CM | POA: Diagnosis not present

## 2020-07-26 DIAGNOSIS — Z8719 Personal history of other diseases of the digestive system: Secondary | ICD-10-CM | POA: Diagnosis not present

## 2020-07-26 DIAGNOSIS — I700 Atherosclerosis of aorta: Secondary | ICD-10-CM | POA: Diagnosis not present

## 2020-07-26 DIAGNOSIS — E7800 Pure hypercholesterolemia, unspecified: Secondary | ICD-10-CM | POA: Diagnosis not present

## 2020-07-26 DIAGNOSIS — G2581 Restless legs syndrome: Secondary | ICD-10-CM | POA: Diagnosis not present

## 2020-08-20 DIAGNOSIS — R1310 Dysphagia, unspecified: Secondary | ICD-10-CM | POA: Diagnosis not present

## 2020-08-20 DIAGNOSIS — K219 Gastro-esophageal reflux disease without esophagitis: Secondary | ICD-10-CM | POA: Diagnosis not present

## 2020-08-22 DIAGNOSIS — E7800 Pure hypercholesterolemia, unspecified: Secondary | ICD-10-CM | POA: Diagnosis not present

## 2020-08-22 DIAGNOSIS — Z8673 Personal history of transient ischemic attack (TIA), and cerebral infarction without residual deficits: Secondary | ICD-10-CM | POA: Diagnosis not present

## 2020-08-22 DIAGNOSIS — I2510 Atherosclerotic heart disease of native coronary artery without angina pectoris: Secondary | ICD-10-CM | POA: Diagnosis not present

## 2020-10-09 ENCOUNTER — Ambulatory Visit: Payer: Medicare Other | Admitting: Cardiology

## 2020-10-18 DIAGNOSIS — E7800 Pure hypercholesterolemia, unspecified: Secondary | ICD-10-CM | POA: Diagnosis not present

## 2020-10-18 DIAGNOSIS — I10 Essential (primary) hypertension: Secondary | ICD-10-CM | POA: Diagnosis not present

## 2020-10-18 DIAGNOSIS — E1165 Type 2 diabetes mellitus with hyperglycemia: Secondary | ICD-10-CM | POA: Diagnosis not present

## 2020-10-22 DIAGNOSIS — I2510 Atherosclerotic heart disease of native coronary artery without angina pectoris: Secondary | ICD-10-CM | POA: Diagnosis not present

## 2020-10-22 DIAGNOSIS — Z8673 Personal history of transient ischemic attack (TIA), and cerebral infarction without residual deficits: Secondary | ICD-10-CM | POA: Diagnosis not present

## 2020-10-22 DIAGNOSIS — E875 Hyperkalemia: Secondary | ICD-10-CM | POA: Diagnosis not present

## 2020-10-22 DIAGNOSIS — I10 Essential (primary) hypertension: Secondary | ICD-10-CM | POA: Diagnosis not present

## 2020-10-22 DIAGNOSIS — E118 Type 2 diabetes mellitus with unspecified complications: Secondary | ICD-10-CM | POA: Diagnosis not present

## 2020-10-22 DIAGNOSIS — E7800 Pure hypercholesterolemia, unspecified: Secondary | ICD-10-CM | POA: Diagnosis not present

## 2020-10-29 DIAGNOSIS — Z86010 Personal history of colonic polyps: Secondary | ICD-10-CM | POA: Diagnosis not present

## 2020-10-29 DIAGNOSIS — K222 Esophageal obstruction: Secondary | ICD-10-CM | POA: Diagnosis not present

## 2020-10-29 DIAGNOSIS — D123 Benign neoplasm of transverse colon: Secondary | ICD-10-CM | POA: Diagnosis not present

## 2020-10-29 DIAGNOSIS — K2100 Gastro-esophageal reflux disease with esophagitis, without bleeding: Secondary | ICD-10-CM | POA: Diagnosis not present

## 2020-10-29 DIAGNOSIS — R1310 Dysphagia, unspecified: Secondary | ICD-10-CM | POA: Diagnosis not present

## 2020-11-01 DIAGNOSIS — D123 Benign neoplasm of transverse colon: Secondary | ICD-10-CM | POA: Diagnosis not present

## 2020-11-07 DIAGNOSIS — C642 Malignant neoplasm of left kidney, except renal pelvis: Secondary | ICD-10-CM | POA: Diagnosis not present

## 2020-11-07 DIAGNOSIS — D649 Anemia, unspecified: Secondary | ICD-10-CM | POA: Diagnosis not present

## 2020-11-07 DIAGNOSIS — K8020 Calculus of gallbladder without cholecystitis without obstruction: Secondary | ICD-10-CM | POA: Diagnosis not present

## 2020-11-07 DIAGNOSIS — Z905 Acquired absence of kidney: Secondary | ICD-10-CM | POA: Diagnosis not present

## 2020-11-11 ENCOUNTER — Ambulatory Visit: Payer: Medicare Other | Admitting: Cardiology

## 2020-11-11 ENCOUNTER — Other Ambulatory Visit: Payer: Self-pay

## 2020-11-11 ENCOUNTER — Encounter: Payer: Self-pay | Admitting: Cardiology

## 2020-11-11 VITALS — BP 140/74 | HR 76 | Temp 97.8°F | Resp 16 | Ht 67.0 in | Wt 206.0 lb

## 2020-11-11 DIAGNOSIS — R931 Abnormal findings on diagnostic imaging of heart and coronary circulation: Secondary | ICD-10-CM

## 2020-11-11 DIAGNOSIS — R0789 Other chest pain: Secondary | ICD-10-CM

## 2020-11-11 DIAGNOSIS — T466X5A Adverse effect of antihyperlipidemic and antiarteriosclerotic drugs, initial encounter: Secondary | ICD-10-CM

## 2020-11-11 DIAGNOSIS — G720 Drug-induced myopathy: Secondary | ICD-10-CM

## 2020-11-11 DIAGNOSIS — E782 Mixed hyperlipidemia: Secondary | ICD-10-CM | POA: Diagnosis not present

## 2020-11-11 DIAGNOSIS — Z905 Acquired absence of kidney: Secondary | ICD-10-CM

## 2020-11-11 DIAGNOSIS — I10 Essential (primary) hypertension: Secondary | ICD-10-CM

## 2020-11-11 MED ORDER — AMLODIPINE BESYLATE 5 MG PO TABS: 5.0000 mg | ORAL_TABLET | Freq: Every day | ORAL | 3 refills | Status: DC

## 2020-11-11 MED ORDER — REPATHA SURECLICK 140 MG/ML ~~LOC~~ SOAJ: 140.0000 mg | SUBCUTANEOUS | 5 refills | Status: DC

## 2020-11-11 NOTE — Progress Notes (Signed)
Patient referred by [name], MD for elevated calcium score  Subjective:   Lance Scott, male    DOB: [birth year], 65 y.o.   MRN: [id number]   Chief Complaint  Patient presents with   Hypertension   Follow-up    1 year     HPI  65 y.o. Caucasian male with hypertension, hyperlipidemia, prediabetes, s/p nephrectomy, elevated calcium score.  Recently, patient experienced retrosternal burning sensation and pain, unrelated to exertion.  This has since improved after he underwent EGD and dilatation.  He is not doing any regular physical exercise, but walks trails every now and then.  On a separate note, blood pressure is elevated today.  Reviewed his recent lipid panel, details below.  He is currently not on statin owing to severe myalgia with several statins.   Initial consultation HPI 09/2019: Patient recently underwent calcium score scan for stratification.  This showed elevated calcium score of 209, distributed across all 3 vessels, but none in left main.  Patient is retired Retail buyer, still helps with certain supervisory roles.  He is fairly active, walks up to 5 miles a day.  He stays active in the yard, is currently building a tree house. He denies chest pain, shortness of breath, palpitations, leg edema, orthopnea, PND, TIA/syncope.  He has history of kidney cancer, underwent nephrectomy.  Therefore, he has solitary kidney.  GFR is 40s-50s   Current Outpatient Medications on File Prior to Visit  Medication Sig Dispense Refill   aspirin EC 81 MG tablet Take 1 tablet (81 mg total) by mouth daily. Swallow whole. 90 tablet 3   rosuvastatin (CRESTOR) 20 MG tablet Take 1 tablet by mouth daily.      telmisartan (MICARDIS) 80 MG tablet Take 1 tablet by mouth daily.     testosterone cypionate (DEPOTESTOSTERONE CYPIONATE) 200 MG/ML injection Inject 200 mg into the muscle every 14 (fourteen) days.     XIGDUO XR 05-998 MG TB24 Take 1 tablet by mouth daily at 12 noon.      No  current facility-administered medications on file prior to visit.    Cardiovascular and other pertinent studies:  EKG 11/11/2020: Sinus rhythm 74 bpm Borderline LVH  CT Cardiac Calcium Scoring 08/08/2019:  1. Total calcium score: 209 2. Left Main: 0 3. Left Anterior Descending: 50 4.Circumflex: 60 5. Rt Coronary: 159 6. Cardiovascular: NL heart size: no Pericardial effusion    Recent labs: Lipid panel 10/23/2020: Cholesterol 202, triglycerides 191, HDL 35, LDL 129  11/08/2019: Glucose 136, BUN/Cr 21/1.5. EGFR 51. Na/K 135/4.6. Rest of the CMP normal  H/H 15/45. MCV 85. Platelets 246  07/2019: Glucose 137, BUN/Cr 16/1.53. EGFR 55. Na/K 136/4.6.  H/H 15.1/45.2 MCV 84.5. Platelets 213 Chol 126, TG 104, HDL 35, LDL 72    Review of Systems  Cardiovascular:  Positive for chest pain. Negative for dyspnea on exertion, leg swelling, palpitations and syncope.        Vitals:   11/11/20 1411 11/11/20 1417  BP: (!) 153/75 140/74  Pulse: 78 76  Resp: 16   Temp: 97.8 F (36.6 C)   SpO2: 98%      Body mass index is 32.26 kg/m. Filed Weights   11/11/20 1411  Weight: 206 lb (93.4 kg)     Objective:   Physical Exam Vitals and nursing note reviewed.  Constitutional:      General: He is not in acute distress. Neck:     Vascular: No JVD.  Cardiovascular:  Rate and Rhythm: Normal rate and regular rhythm.     Heart sounds: Normal heart sounds. No murmur heard. Pulmonary:     Effort: Pulmonary effort is normal.     Breath sounds: Normal breath sounds. No wheezing or rales.  Musculoskeletal:     Right lower leg: No edema.     Left lower leg: No edema.         Assessment & Recommendations:   64 y.o. Caucasian male with hypertension, hyperlipidemia, prediabetes, s/p nephrectomy, elevated calcium score, atypical chest pain  Atypical chest pain:  Likely related to esophageal etiology.  Nonetheless, given his limited physical activity, and elevated coronary  calcium score exercise, recommend exercise nuclear stress test for risk stratification.  Elevated calcium score: 209, distributed across LAD, left circumflex, RCA.  No calcium seen in left main (09/2019).  He is currently not on aspirin.  This is reasonable unless stress test shows evidence of obstructive coronary artery disease. LDL is not at goal.  He has reportedly tried main doses of rosuvastatin, atorvastatin, as well as next visit with all because severe myalgia symptoms. Recommend Repatha twice a month. Repeat lipid panel in 3 months  Hypertension: Uncontrolled.  Added amlodipine 5 mg daily.  Recommend low-salt diet, regular aerobic activity with up to 10,000 steps daily.   Hypertension: Controlled  Hyperlipidemia: Continue Crestor 20 mg  Follow-up in 1 year.  Should he develop any symptoms of exertional chest pain or dyspnea, he knows to contact me sooner.   Thank you for referring the patient to [place]. Please feel free to contact with any questions.   Nigel Mormon, MD Pager: [phone] Office: [phone]

## 2020-11-20 DIAGNOSIS — D3131 Benign neoplasm of right choroid: Secondary | ICD-10-CM | POA: Diagnosis not present

## 2020-11-20 DIAGNOSIS — H02831 Dermatochalasis of right upper eyelid: Secondary | ICD-10-CM | POA: Diagnosis not present

## 2020-11-20 DIAGNOSIS — R7309 Other abnormal glucose: Secondary | ICD-10-CM | POA: Diagnosis not present

## 2020-11-20 DIAGNOSIS — H2513 Age-related nuclear cataract, bilateral: Secondary | ICD-10-CM | POA: Diagnosis not present

## 2020-11-20 DIAGNOSIS — H43813 Vitreous degeneration, bilateral: Secondary | ICD-10-CM | POA: Diagnosis not present

## 2020-11-20 DIAGNOSIS — H04123 Dry eye syndrome of bilateral lacrimal glands: Secondary | ICD-10-CM | POA: Diagnosis not present

## 2020-11-20 DIAGNOSIS — Z9889 Other specified postprocedural states: Secondary | ICD-10-CM | POA: Diagnosis not present

## 2020-11-20 DIAGNOSIS — H02834 Dermatochalasis of left upper eyelid: Secondary | ICD-10-CM | POA: Diagnosis not present

## 2020-11-25 ENCOUNTER — Other Ambulatory Visit: Payer: Self-pay

## 2020-11-25 ENCOUNTER — Ambulatory Visit: Payer: Medicare Other

## 2020-11-25 DIAGNOSIS — R0789 Other chest pain: Secondary | ICD-10-CM | POA: Diagnosis not present

## 2020-11-25 LAB — PCV MYOCARDIAL PERFUSION WO LEXISCAN
Angina Index: 0
ST Depression (mm): 0 mm

## 2021-02-07 ENCOUNTER — Other Ambulatory Visit (HOSPITAL_COMMUNITY): Payer: Self-pay | Admitting: Cardiology

## 2021-02-07 DIAGNOSIS — E782 Mixed hyperlipidemia: Secondary | ICD-10-CM | POA: Diagnosis not present

## 2021-02-08 LAB — LIPID PANEL
Chol/HDL Ratio: 3.2 ratio (ref 0.0–5.0)
Cholesterol, Total: 111 mg/dL (ref 100–199)
HDL: 35 mg/dL — ABNORMAL LOW (ref >39)
LDL Chol Calc (NIH): 51 mg/dL (ref 0–99)
Triglycerides: 143 mg/dL (ref 0–149)
VLDL Cholesterol Cal: 25 mg/dL (ref 5–40)

## 2021-02-11 DIAGNOSIS — R1310 Dysphagia, unspecified: Secondary | ICD-10-CM | POA: Diagnosis not present

## 2021-02-11 DIAGNOSIS — K219 Gastro-esophageal reflux disease without esophagitis: Secondary | ICD-10-CM | POA: Diagnosis not present

## 2021-02-11 NOTE — Progress Notes (Signed)
Patient referred by [name], MD for elevated calcium score  Subjective:   Lance Scott, male    DOB: 07/02/1955, 66 y.o.   MRN: [id number]   Chief Complaint  Patient presents with   Hypertension   Follow-up     HPI  66 y.o. Caucasian male with hypertension, hyperlipidemia, prediabetes, s/p nephrectomy, elevated calcium score.  Patient does report injection site related burning that lasts only for a min or so. No other side effects from [place]. Discussed recent lab results with the patient, details below.   It appears that he is currently taking telmisartan-amlodipine 40-10 mg, half tablet a day, as well as amlodipine 5 mg prescribed by me.   Initial consultation HPI 09/2019: Patient recently underwent calcium score scan for stratification.  This showed elevated calcium score of 209, distributed across all 3 vessels, but none in left main.  Patient is retired Retail buyer, still helps with certain supervisory roles.  He is fairly active, walks up to 5 miles a day.  He stays active in the yard, is currently building a tree house. He denies chest pain, shortness of breath, palpitations, leg edema, orthopnea, PND, TIA/syncope.  He has history of kidney cancer, underwent nephrectomy.  Therefore, he has solitary kidney.  GFR is 40s-50s   Current Outpatient Medications on File Prior to Visit  Medication Sig Dispense Refill   amLODipine (NORVASC) 5 MG tablet Take 1 tablet (5 mg total) by mouth daily. 30 tablet 3   aspirin EC 81 MG tablet Take 1 tablet (81 mg total) by mouth daily. Swallow whole. (Patient not taking: Reported on 11/11/2020) 90 tablet 3   Evolocumab (REPATHA SURECLICK) 528 MG/ML SOAJ Inject 140 mg into the skin every 14 (fourteen) days. 6 mL 5   telmisartan (MICARDIS) 80 MG tablet Take 1 tablet by mouth daily.     testosterone cypionate (DEPOTESTOSTERONE CYPIONATE) 200 MG/ML injection Inject 200 mg into the muscle every 14 (fourteen) days.     XIGDUO XR  05-998 MG TB24 Take 1 tablet by mouth daily at 12 noon.      No current facility-administered medications on file prior to visit.    Cardiovascular and other pertinent studies:  Exercise Tetrofosmin stress test 11/25/2020: Exercise nuclear stress test was performed using Bruce protocol. Patient reached 9.7 METS, and 93% of age predicted maximum heart rate. Exercise capacity was good. No chest pain reported, dyspnea reported. Heart rate and hemodynamic response were normal. Stress EKG revealed no ischemic changes. Normal myocardial perfusion. Stress LV EF is calculated 49%, although visually appears normal. Low risk study.  EKG 11/11/2020: Sinus rhythm 74 bpm Borderline LVH  CT Cardiac Calcium Scoring 08/08/2019:  1. Total calcium score: 209 2. Left Main: 0 3. Left Anterior Descending: 50 4.Circumflex: 60 5. Rt Coronary: 159 6. Cardiovascular: NL heart size: no Pericardial effusion    Recent labs: Lipid panel 02/07/2021: Cholesterol 111, triglycerides 143, HDL 35, LDL 51  Lipid panel 10/23/2020: Cholesterol 202, triglycerides 191, HDL 35, LDL 129  11/08/2019: Glucose 136, BUN/Cr 21/1.5. EGFR 51. Na/K 135/4.6. Rest of the CMP normal  H/H 15/45. MCV 85. Platelets 246  07/2019: Glucose 137, BUN/Cr 16/1.53. EGFR 55. Na/K 136/4.6.  H/H 15.1/45.2 MCV 84.5. Platelets 213 Chol 126, TG 104, HDL 35, LDL 72    Review of Systems  Cardiovascular:  Positive for chest pain. Negative for dyspnea on exertion, leg swelling, palpitations and syncope.        Vitals:   02/12/21 1003  BP:  136/81  Pulse: 63  Temp: 97.9 F (36.6 C)  SpO2: 97%     Body mass index is 31.64 kg/m. Filed Weights   02/12/21 1003  Weight: 202 lb (91.6 kg)     Objective:   Physical Exam Vitals and nursing note reviewed.  Constitutional:      General: He is not in acute distress. Neck:     Vascular: No JVD.  Cardiovascular:     Rate and Rhythm: Normal rate and regular rhythm.     Heart sounds:  Normal heart sounds. No murmur heard. Pulmonary:     Effort: Pulmonary effort is normal.     Breath sounds: Normal breath sounds. No wheezing or rales.  Musculoskeletal:     Right lower leg: No edema.     Left lower leg: No edema.         Assessment & Recommendations:   66 y.o. Caucasian male with hypertension, hyperlipidemia, prediabetes, s/p nephrectomy, elevated calcium score, atypical chest pain  Atypical chest pain:  Likely related to esophageal etiology.  Nonetheless, given his limited physical activity, and elevated coronary calcium score exercise, recommend exercise nuclear stress test for risk stratification.  Elevated calcium score: 209, distributed across LAD, left circumflex, RCA.  No calcium seen in left main (09/2019).  He is currently not on aspirin.  This is reasonable unless stress test shows evidence of obstructive coronary artery disease. Severe myalgias with high intensity statin LDL down from 129 to 51 on Repatha.   Hypertension: Controlled.  Currently on telmisartan-amlodipine 40-10 mg, half tablet a day, and amlodipine 5 mg daily-for total of amlodipine 10 mg daily and telmisartan 20 mg daily.  I do not want to increase the telmisartan dose given his known renal dysfunction.  Therefore, we will continue to separate tablets as above, off which amlodipine 5 mg was prescribed by me.  F/u in 6 months   Nigel Mormon, MD Pager: [phone] Office: [phone]

## 2021-02-12 ENCOUNTER — Other Ambulatory Visit: Payer: Self-pay

## 2021-02-12 ENCOUNTER — Ambulatory Visit: Payer: Medicare Other | Admitting: Cardiology

## 2021-02-12 ENCOUNTER — Encounter: Payer: Self-pay | Admitting: Cardiology

## 2021-02-12 VITALS — BP 136/81 | HR 63 | Temp 97.9°F | Ht 67.0 in | Wt 202.0 lb

## 2021-02-12 DIAGNOSIS — G720 Drug-induced myopathy: Secondary | ICD-10-CM

## 2021-02-12 DIAGNOSIS — I10 Essential (primary) hypertension: Secondary | ICD-10-CM | POA: Diagnosis not present

## 2021-02-12 DIAGNOSIS — E782 Mixed hyperlipidemia: Secondary | ICD-10-CM

## 2021-02-12 DIAGNOSIS — T466X5A Adverse effect of antihyperlipidemic and antiarteriosclerotic drugs, initial encounter: Secondary | ICD-10-CM | POA: Diagnosis not present

## 2021-02-12 MED ORDER — AMLODIPINE BESYLATE 5 MG PO TABS: 5.0000 mg | ORAL_TABLET | Freq: Every day | ORAL | 3 refills | Status: DC

## 2021-04-10 DIAGNOSIS — R051 Acute cough: Secondary | ICD-10-CM | POA: Diagnosis not present

## 2021-04-10 DIAGNOSIS — J0100 Acute maxillary sinusitis, unspecified: Secondary | ICD-10-CM | POA: Diagnosis not present

## 2021-04-10 DIAGNOSIS — Z20822 Contact with and (suspected) exposure to covid-19: Secondary | ICD-10-CM | POA: Diagnosis not present

## 2021-06-17 DIAGNOSIS — K2100 Gastro-esophageal reflux disease with esophagitis, without bleeding: Secondary | ICD-10-CM | POA: Diagnosis not present

## 2021-06-17 DIAGNOSIS — R195 Other fecal abnormalities: Secondary | ICD-10-CM | POA: Diagnosis not present

## 2021-06-17 DIAGNOSIS — R1310 Dysphagia, unspecified: Secondary | ICD-10-CM | POA: Diagnosis not present

## 2021-07-25 DIAGNOSIS — E1122 Type 2 diabetes mellitus with diabetic chronic kidney disease: Secondary | ICD-10-CM | POA: Diagnosis not present

## 2021-07-25 DIAGNOSIS — Z7982 Long term (current) use of aspirin: Secondary | ICD-10-CM | POA: Diagnosis not present

## 2021-07-25 DIAGNOSIS — I10 Essential (primary) hypertension: Secondary | ICD-10-CM | POA: Diagnosis not present

## 2021-07-25 DIAGNOSIS — E7800 Pure hypercholesterolemia, unspecified: Secondary | ICD-10-CM | POA: Diagnosis not present

## 2021-07-30 DIAGNOSIS — Z1212 Encounter for screening for malignant neoplasm of rectum: Secondary | ICD-10-CM | POA: Diagnosis not present

## 2021-07-30 DIAGNOSIS — Z23 Encounter for immunization: Secondary | ICD-10-CM | POA: Diagnosis not present

## 2021-07-30 DIAGNOSIS — Z0000 Encounter for general adult medical examination without abnormal findings: Secondary | ICD-10-CM | POA: Diagnosis not present

## 2021-07-30 DIAGNOSIS — E1122 Type 2 diabetes mellitus with diabetic chronic kidney disease: Secondary | ICD-10-CM | POA: Diagnosis not present

## 2021-07-30 DIAGNOSIS — I10 Essential (primary) hypertension: Secondary | ICD-10-CM | POA: Diagnosis not present

## 2021-07-30 DIAGNOSIS — L570 Actinic keratosis: Secondary | ICD-10-CM | POA: Diagnosis not present

## 2021-07-30 DIAGNOSIS — I2510 Atherosclerotic heart disease of native coronary artery without angina pectoris: Secondary | ICD-10-CM | POA: Diagnosis not present

## 2021-07-30 DIAGNOSIS — I700 Atherosclerosis of aorta: Secondary | ICD-10-CM | POA: Diagnosis not present

## 2021-07-30 DIAGNOSIS — Z8673 Personal history of transient ischemic attack (TIA), and cerebral infarction without residual deficits: Secondary | ICD-10-CM | POA: Diagnosis not present

## 2021-08-13 DIAGNOSIS — M79672 Pain in left foot: Secondary | ICD-10-CM | POA: Diagnosis not present

## 2021-08-14 NOTE — Progress Notes (Unsigned)
Patient referred by [name], MD for elevated calcium score  Subjective:   Lance Scott, male    DOB: [birth year], 66 y.o.   MRN: [id number]   No chief complaint on file.    HPI  66 y.o. Caucasian male with hypertension, hyperlipidemia, prediabetes, s/p nephrectomy, elevated calcium score.  Patient does report injection site related burning that lasts only for a min or so. No other side effects from [place]. Discussed recent lab results with the patient, details below.   It appears that he is currently taking telmisartan-amlodipine 40-10 mg, half tablet a day, as well as amlodipine 5 mg prescribed by me.   Initial consultation HPI 09/2019: Patient recently underwent calcium score scan for stratification.  This showed elevated calcium score of 209, distributed across all 3 vessels, but none in left main.  Patient is retired Retail buyer, still helps with certain supervisory roles.  He is fairly active, walks up to 5 miles a day.  He stays active in the yard, is currently building a tree house. He denies chest pain, shortness of breath, palpitations, leg edema, orthopnea, PND, TIA/syncope.  He has history of kidney cancer, underwent nephrectomy.  Therefore, he has solitary kidney.  GFR is 40s-50s    Current Outpatient Medications:    Telmisartan-amLODIPine 40-10 MG TABS, Take 0.5 mg by mouth daily., Disp: , Rfl:    testosterone cypionate (DEPOTESTOSTERONE CYPIONATE) 200 MG/ML injection, Inject 200 mg into the muscle every 14 (fourteen) days., Disp: , Rfl:    XIGDUO XR 05-998 MG TB24, Take 1 tablet by mouth daily at 12 noon. , Disp: , Rfl:    tadalafil (CIALIS) 5 MG tablet, Take 1 tablet by mouth., Disp: , Rfl:   Cardiovascular and other pertinent studies:  EKG 08/15/2021: Sinus rhythm 62 bpm Normal EKG  Exercise Tetrofosmin stress test 11/25/2020: Exercise nuclear stress test was performed using Bruce protocol. Patient reached 9.7 METS, and 93% of age predicted  maximum heart rate. Exercise capacity was good. No chest pain reported, dyspnea reported. Heart rate and hemodynamic response were normal. Stress EKG revealed no ischemic changes. Normal myocardial perfusion. Stress LV EF is calculated 49%, although visually appears normal. Low risk study.  CT Cardiac Calcium Scoring 08/08/2019:  1. Total calcium score: 209 2. Left Main: 0 3. Left Anterior Descending: 50 4.Circumflex: 60 5. Rt Coronary: 159 6. Cardiovascular: NL heart size: no Pericardial effusion   Recent labs: Recent labs: 07/25/2021: Glucose 133, BUN/Cr 14/1.45. EGFR 50. Na/K 4.3/133. Rest of the CMP normal H/H 15/45. MCV 84.3. Platelets 233 HbA1C 7.5% Chol 170, TG 181, HDL 36, LDL 98   Review of Systems  Cardiovascular:  Positive for chest pain. Negative for dyspnea on exertion, leg swelling, palpitations and syncope.         Vitals:   08/15/21 1401  BP: (!) 144/76  Pulse: 67  Resp: 16  Temp: 98 F (36.7 C)     Body mass index is 32.89 kg/m. Filed Weights   08/15/21 1401  Weight: 210 lb (95.3 kg)     Objective:   Physical Exam Vitals and nursing note reviewed.  Constitutional:      General: He is not in acute distress. Neck:     Vascular: No JVD.  Cardiovascular:     Rate and Rhythm: Normal rate and regular rhythm.     Heart sounds: Normal heart sounds. No murmur heard. Pulmonary:     Effort: Pulmonary effort is normal.     Breath sounds:  Normal breath sounds. No wheezing or rales.  Musculoskeletal:     Right lower leg: No edema.     Left lower leg: No edema.          Assessment & Recommendations:   66 y.o. Caucasian male with hypertension, hyperlipidemia, prediabetes, s/p nephrectomy, elevated calcium score, atypical chest pain  ***Atypical chest pain:  Likely related to esophageal etiology.  Nonetheless, given his limited physical activity, and elevated coronary calcium score exercise, recommend exercise nuclear stress test for risk  stratification.  Elevated calcium score: 209, distributed across LAD, left circumflex, RCA.  No calcium seen in left main (09/2019).  He is currently not on aspirin.  This is reasonable unless stress test shows evidence of obstructive coronary artery disease. Severe myalgias with high intensity statin LDL 98***  *** Hypertension: Controlled.  Currently on telmisartan-amlodipine 40-10 mg, half tablet a day, and amlodipine 5 mg daily-for total of amlodipine 10 mg daily and telmisartan 20 mg daily.  I do not want to increase the telmisartan dose given his known renal dysfunction.  Therefore, we will continue to separate tablets as above, off which amlodipine 5 mg was prescribed by me.  F/u in 6 months   Nigel Mormon, MD Pager: [phone] Office: [phone]

## 2021-08-15 ENCOUNTER — Encounter: Payer: Self-pay | Admitting: Cardiology

## 2021-08-15 ENCOUNTER — Ambulatory Visit: Payer: Medicare Other | Admitting: Cardiology

## 2021-08-15 VITALS — BP 144/76 | HR 67 | Temp 98.0°F | Resp 16 | Ht 67.0 in | Wt 210.0 lb

## 2021-08-15 DIAGNOSIS — I10 Essential (primary) hypertension: Secondary | ICD-10-CM

## 2021-08-15 DIAGNOSIS — E782 Mixed hyperlipidemia: Secondary | ICD-10-CM | POA: Diagnosis not present

## 2021-08-15 MED ORDER — NEXLIZET 180-10 MG PO TABS: 1.0000 | ORAL_TABLET | Freq: Every day | ORAL | 3 refills | Status: AC

## 2021-08-16 ENCOUNTER — Encounter: Payer: Self-pay | Admitting: Cardiology

## 2021-08-28 DIAGNOSIS — I10 Essential (primary) hypertension: Secondary | ICD-10-CM | POA: Diagnosis not present

## 2021-08-28 DIAGNOSIS — E1122 Type 2 diabetes mellitus with diabetic chronic kidney disease: Secondary | ICD-10-CM | POA: Diagnosis not present

## 2021-08-28 DIAGNOSIS — E349 Endocrine disorder, unspecified: Secondary | ICD-10-CM | POA: Diagnosis not present

## 2021-08-28 DIAGNOSIS — E7800 Pure hypercholesterolemia, unspecified: Secondary | ICD-10-CM | POA: Diagnosis not present

## 2021-08-28 DIAGNOSIS — Z8673 Personal history of transient ischemic attack (TIA), and cerebral infarction without residual deficits: Secondary | ICD-10-CM | POA: Diagnosis not present

## 2021-10-01 DIAGNOSIS — R1310 Dysphagia, unspecified: Secondary | ICD-10-CM | POA: Diagnosis not present

## 2021-10-01 DIAGNOSIS — K219 Gastro-esophageal reflux disease without esophagitis: Secondary | ICD-10-CM | POA: Diagnosis not present

## 2021-10-23 DIAGNOSIS — L814 Other melanin hyperpigmentation: Secondary | ICD-10-CM | POA: Diagnosis not present

## 2021-10-23 DIAGNOSIS — L821 Other seborrheic keratosis: Secondary | ICD-10-CM | POA: Diagnosis not present

## 2021-10-23 DIAGNOSIS — L570 Actinic keratosis: Secondary | ICD-10-CM | POA: Diagnosis not present

## 2021-11-20 DIAGNOSIS — Z23 Encounter for immunization: Secondary | ICD-10-CM | POA: Diagnosis not present

## 2021-11-24 DIAGNOSIS — E119 Type 2 diabetes mellitus without complications: Secondary | ICD-10-CM | POA: Diagnosis not present

## 2021-11-24 DIAGNOSIS — H02831 Dermatochalasis of right upper eyelid: Secondary | ICD-10-CM | POA: Diagnosis not present

## 2021-11-24 DIAGNOSIS — D3131 Benign neoplasm of right choroid: Secondary | ICD-10-CM | POA: Diagnosis not present

## 2021-11-24 DIAGNOSIS — H04123 Dry eye syndrome of bilateral lacrimal glands: Secondary | ICD-10-CM | POA: Diagnosis not present

## 2021-11-24 DIAGNOSIS — H02834 Dermatochalasis of left upper eyelid: Secondary | ICD-10-CM | POA: Diagnosis not present

## 2021-11-24 DIAGNOSIS — H2513 Age-related nuclear cataract, bilateral: Secondary | ICD-10-CM | POA: Diagnosis not present

## 2021-11-24 DIAGNOSIS — H43813 Vitreous degeneration, bilateral: Secondary | ICD-10-CM | POA: Diagnosis not present

## 2021-11-26 DIAGNOSIS — E7800 Pure hypercholesterolemia, unspecified: Secondary | ICD-10-CM | POA: Diagnosis not present

## 2021-11-26 DIAGNOSIS — I10 Essential (primary) hypertension: Secondary | ICD-10-CM | POA: Diagnosis not present

## 2021-11-26 DIAGNOSIS — E1122 Type 2 diabetes mellitus with diabetic chronic kidney disease: Secondary | ICD-10-CM | POA: Diagnosis not present

## 2021-11-26 DIAGNOSIS — E349 Endocrine disorder, unspecified: Secondary | ICD-10-CM | POA: Diagnosis not present

## 2021-11-28 DIAGNOSIS — R300 Dysuria: Secondary | ICD-10-CM | POA: Diagnosis not present

## 2021-12-03 DIAGNOSIS — N390 Urinary tract infection, site not specified: Secondary | ICD-10-CM | POA: Diagnosis not present

## 2021-12-03 DIAGNOSIS — E349 Endocrine disorder, unspecified: Secondary | ICD-10-CM | POA: Diagnosis not present

## 2021-12-03 DIAGNOSIS — B952 Enterococcus as the cause of diseases classified elsewhere: Secondary | ICD-10-CM | POA: Diagnosis not present

## 2021-12-03 DIAGNOSIS — Z8673 Personal history of transient ischemic attack (TIA), and cerebral infarction without residual deficits: Secondary | ICD-10-CM | POA: Diagnosis not present

## 2021-12-03 DIAGNOSIS — E1122 Type 2 diabetes mellitus with diabetic chronic kidney disease: Secondary | ICD-10-CM | POA: Diagnosis not present

## 2021-12-03 DIAGNOSIS — E7800 Pure hypercholesterolemia, unspecified: Secondary | ICD-10-CM | POA: Diagnosis not present

## 2021-12-03 DIAGNOSIS — I10 Essential (primary) hypertension: Secondary | ICD-10-CM | POA: Diagnosis not present

## 2021-12-10 ENCOUNTER — Ambulatory Visit: Payer: Medicare Other | Admitting: Cardiology

## 2022-01-12 DIAGNOSIS — C642 Malignant neoplasm of left kidney, except renal pelvis: Secondary | ICD-10-CM | POA: Diagnosis not present

## 2022-02-10 DIAGNOSIS — H02413 Mechanical ptosis of bilateral eyelids: Secondary | ICD-10-CM | POA: Diagnosis not present

## 2022-02-10 DIAGNOSIS — H02423 Myogenic ptosis of bilateral eyelids: Secondary | ICD-10-CM | POA: Diagnosis not present

## 2022-02-10 DIAGNOSIS — H02834 Dermatochalasis of left upper eyelid: Secondary | ICD-10-CM | POA: Diagnosis not present

## 2022-02-10 DIAGNOSIS — H02421 Myogenic ptosis of right eyelid: Secondary | ICD-10-CM | POA: Diagnosis not present

## 2022-02-10 DIAGNOSIS — H02411 Mechanical ptosis of right eyelid: Secondary | ICD-10-CM | POA: Diagnosis not present

## 2022-02-10 DIAGNOSIS — H57813 Brow ptosis, bilateral: Secondary | ICD-10-CM | POA: Diagnosis not present

## 2022-02-10 DIAGNOSIS — H02132 Senile ectropion of right lower eyelid: Secondary | ICD-10-CM | POA: Diagnosis not present

## 2022-02-10 DIAGNOSIS — H02412 Mechanical ptosis of left eyelid: Secondary | ICD-10-CM | POA: Diagnosis not present

## 2022-02-10 DIAGNOSIS — H0279 Other degenerative disorders of eyelid and periocular area: Secondary | ICD-10-CM | POA: Diagnosis not present

## 2022-02-10 DIAGNOSIS — H53483 Generalized contraction of visual field, bilateral: Secondary | ICD-10-CM | POA: Diagnosis not present

## 2022-02-10 DIAGNOSIS — H02422 Myogenic ptosis of left eyelid: Secondary | ICD-10-CM | POA: Diagnosis not present

## 2022-02-10 DIAGNOSIS — H02831 Dermatochalasis of right upper eyelid: Secondary | ICD-10-CM | POA: Diagnosis not present

## 2022-03-04 DIAGNOSIS — E7800 Pure hypercholesterolemia, unspecified: Secondary | ICD-10-CM | POA: Diagnosis not present

## 2022-03-04 DIAGNOSIS — E349 Endocrine disorder, unspecified: Secondary | ICD-10-CM | POA: Diagnosis not present

## 2022-03-04 DIAGNOSIS — I10 Essential (primary) hypertension: Secondary | ICD-10-CM | POA: Diagnosis not present

## 2022-03-04 DIAGNOSIS — E1122 Type 2 diabetes mellitus with diabetic chronic kidney disease: Secondary | ICD-10-CM | POA: Diagnosis not present

## 2022-03-11 DIAGNOSIS — E349 Endocrine disorder, unspecified: Secondary | ICD-10-CM | POA: Diagnosis not present

## 2022-03-11 DIAGNOSIS — Z8673 Personal history of transient ischemic attack (TIA), and cerebral infarction without residual deficits: Secondary | ICD-10-CM | POA: Diagnosis not present

## 2022-03-11 DIAGNOSIS — I10 Essential (primary) hypertension: Secondary | ICD-10-CM | POA: Diagnosis not present

## 2022-03-11 DIAGNOSIS — E1122 Type 2 diabetes mellitus with diabetic chronic kidney disease: Secondary | ICD-10-CM | POA: Diagnosis not present

## 2022-03-11 DIAGNOSIS — E7800 Pure hypercholesterolemia, unspecified: Secondary | ICD-10-CM | POA: Diagnosis not present

## 2022-04-07 DIAGNOSIS — H02411 Mechanical ptosis of right eyelid: Secondary | ICD-10-CM | POA: Diagnosis not present

## 2022-04-07 DIAGNOSIS — H02423 Myogenic ptosis of bilateral eyelids: Secondary | ICD-10-CM | POA: Diagnosis not present

## 2022-04-07 DIAGNOSIS — H02132 Senile ectropion of right lower eyelid: Secondary | ICD-10-CM | POA: Diagnosis not present

## 2022-04-07 DIAGNOSIS — H02834 Dermatochalasis of left upper eyelid: Secondary | ICD-10-CM | POA: Diagnosis not present

## 2022-04-07 DIAGNOSIS — H57813 Brow ptosis, bilateral: Secondary | ICD-10-CM | POA: Diagnosis not present

## 2022-04-07 DIAGNOSIS — H02413 Mechanical ptosis of bilateral eyelids: Secondary | ICD-10-CM | POA: Diagnosis not present

## 2022-04-07 DIAGNOSIS — H0279 Other degenerative disorders of eyelid and periocular area: Secondary | ICD-10-CM | POA: Diagnosis not present

## 2022-04-07 DIAGNOSIS — H02422 Myogenic ptosis of left eyelid: Secondary | ICD-10-CM | POA: Diagnosis not present

## 2022-04-07 DIAGNOSIS — H02412 Mechanical ptosis of left eyelid: Secondary | ICD-10-CM | POA: Diagnosis not present

## 2022-04-07 DIAGNOSIS — H02421 Myogenic ptosis of right eyelid: Secondary | ICD-10-CM | POA: Diagnosis not present

## 2022-04-07 DIAGNOSIS — H02831 Dermatochalasis of right upper eyelid: Secondary | ICD-10-CM | POA: Diagnosis not present

## 2022-04-07 DIAGNOSIS — H02135 Senile ectropion of left lower eyelid: Secondary | ICD-10-CM | POA: Diagnosis not present

## 2022-04-28 DIAGNOSIS — Z9889 Other specified postprocedural states: Secondary | ICD-10-CM | POA: Diagnosis not present

## 2022-05-13 DIAGNOSIS — C642 Malignant neoplasm of left kidney, except renal pelvis: Secondary | ICD-10-CM | POA: Diagnosis not present

## 2022-05-25 DIAGNOSIS — L905 Scar conditions and fibrosis of skin: Secondary | ICD-10-CM | POA: Diagnosis not present

## 2022-05-28 DIAGNOSIS — K8020 Calculus of gallbladder without cholecystitis without obstruction: Secondary | ICD-10-CM | POA: Diagnosis not present

## 2022-05-28 DIAGNOSIS — C642 Malignant neoplasm of left kidney, except renal pelvis: Secondary | ICD-10-CM | POA: Diagnosis not present

## 2022-05-28 DIAGNOSIS — C641 Malignant neoplasm of right kidney, except renal pelvis: Secondary | ICD-10-CM | POA: Diagnosis not present

## 2022-05-29 DIAGNOSIS — Z20822 Contact with and (suspected) exposure to covid-19: Secondary | ICD-10-CM | POA: Diagnosis not present

## 2022-05-29 DIAGNOSIS — J069 Acute upper respiratory infection, unspecified: Secondary | ICD-10-CM | POA: Diagnosis not present

## 2022-05-29 DIAGNOSIS — J029 Acute pharyngitis, unspecified: Secondary | ICD-10-CM | POA: Diagnosis not present

## 2022-05-29 DIAGNOSIS — R051 Acute cough: Secondary | ICD-10-CM | POA: Diagnosis not present

## 2022-07-22 DIAGNOSIS — E7800 Pure hypercholesterolemia, unspecified: Secondary | ICD-10-CM | POA: Diagnosis not present

## 2022-07-22 DIAGNOSIS — E1165 Type 2 diabetes mellitus with hyperglycemia: Secondary | ICD-10-CM | POA: Diagnosis not present

## 2022-08-05 DIAGNOSIS — Z85528 Personal history of other malignant neoplasm of kidney: Secondary | ICD-10-CM | POA: Diagnosis not present

## 2022-08-05 DIAGNOSIS — Z8719 Personal history of other diseases of the digestive system: Secondary | ICD-10-CM | POA: Diagnosis not present

## 2022-08-05 DIAGNOSIS — I700 Atherosclerosis of aorta: Secondary | ICD-10-CM | POA: Diagnosis not present

## 2022-08-05 DIAGNOSIS — I10 Essential (primary) hypertension: Secondary | ICD-10-CM | POA: Diagnosis not present

## 2022-08-05 DIAGNOSIS — I2510 Atherosclerotic heart disease of native coronary artery without angina pectoris: Secondary | ICD-10-CM | POA: Diagnosis not present

## 2022-08-05 DIAGNOSIS — E1122 Type 2 diabetes mellitus with diabetic chronic kidney disease: Secondary | ICD-10-CM | POA: Diagnosis not present

## 2022-08-05 DIAGNOSIS — G720 Drug-induced myopathy: Secondary | ICD-10-CM | POA: Diagnosis not present

## 2022-08-05 DIAGNOSIS — Z0000 Encounter for general adult medical examination without abnormal findings: Secondary | ICD-10-CM | POA: Diagnosis not present

## 2022-08-24 DIAGNOSIS — H0279 Other degenerative disorders of eyelid and periocular area: Secondary | ICD-10-CM | POA: Diagnosis not present

## 2022-08-24 DIAGNOSIS — L905 Scar conditions and fibrosis of skin: Secondary | ICD-10-CM | POA: Diagnosis not present

## 2022-09-02 DIAGNOSIS — E349 Endocrine disorder, unspecified: Secondary | ICD-10-CM | POA: Diagnosis not present

## 2022-09-02 DIAGNOSIS — T466X5A Adverse effect of antihyperlipidemic and antiarteriosclerotic drugs, initial encounter: Secondary | ICD-10-CM | POA: Diagnosis not present

## 2022-09-02 DIAGNOSIS — E1122 Type 2 diabetes mellitus with diabetic chronic kidney disease: Secondary | ICD-10-CM | POA: Diagnosis not present

## 2022-09-02 DIAGNOSIS — I10 Essential (primary) hypertension: Secondary | ICD-10-CM | POA: Diagnosis not present

## 2022-09-02 DIAGNOSIS — M791 Myalgia, unspecified site: Secondary | ICD-10-CM | POA: Diagnosis not present

## 2022-09-02 DIAGNOSIS — E7800 Pure hypercholesterolemia, unspecified: Secondary | ICD-10-CM | POA: Diagnosis not present

## 2022-09-07 DIAGNOSIS — K219 Gastro-esophageal reflux disease without esophagitis: Secondary | ICD-10-CM | POA: Diagnosis not present

## 2022-09-07 DIAGNOSIS — Z86010 Personal history of colonic polyps: Secondary | ICD-10-CM | POA: Diagnosis not present

## 2022-09-07 DIAGNOSIS — R1310 Dysphagia, unspecified: Secondary | ICD-10-CM | POA: Diagnosis not present

## 2022-09-09 DIAGNOSIS — R1310 Dysphagia, unspecified: Secondary | ICD-10-CM | POA: Diagnosis not present

## 2022-09-09 DIAGNOSIS — B3781 Candidal esophagitis: Secondary | ICD-10-CM | POA: Diagnosis not present

## 2022-09-09 DIAGNOSIS — K222 Esophageal obstruction: Secondary | ICD-10-CM | POA: Diagnosis not present

## 2022-09-09 DIAGNOSIS — K2100 Gastro-esophageal reflux disease with esophagitis, without bleeding: Secondary | ICD-10-CM | POA: Diagnosis not present

## 2022-09-09 DIAGNOSIS — K219 Gastro-esophageal reflux disease without esophagitis: Secondary | ICD-10-CM | POA: Diagnosis not present

## 2022-09-16 DIAGNOSIS — B3781 Candidal esophagitis: Secondary | ICD-10-CM | POA: Diagnosis not present

## 2022-09-16 DIAGNOSIS — K219 Gastro-esophageal reflux disease without esophagitis: Secondary | ICD-10-CM | POA: Diagnosis not present

## 2022-10-27 DIAGNOSIS — K2100 Gastro-esophageal reflux disease with esophagitis, without bleeding: Secondary | ICD-10-CM | POA: Diagnosis not present

## 2022-10-27 DIAGNOSIS — R1310 Dysphagia, unspecified: Secondary | ICD-10-CM | POA: Diagnosis not present

## 2022-11-25 DIAGNOSIS — I10 Essential (primary) hypertension: Secondary | ICD-10-CM | POA: Diagnosis not present

## 2022-11-25 DIAGNOSIS — E7800 Pure hypercholesterolemia, unspecified: Secondary | ICD-10-CM | POA: Diagnosis not present

## 2022-11-25 DIAGNOSIS — E349 Endocrine disorder, unspecified: Secondary | ICD-10-CM | POA: Diagnosis not present

## 2022-11-25 DIAGNOSIS — E1122 Type 2 diabetes mellitus with diabetic chronic kidney disease: Secondary | ICD-10-CM | POA: Diagnosis not present

## 2022-12-03 DIAGNOSIS — I10 Essential (primary) hypertension: Secondary | ICD-10-CM | POA: Diagnosis not present

## 2022-12-03 DIAGNOSIS — I2510 Atherosclerotic heart disease of native coronary artery without angina pectoris: Secondary | ICD-10-CM | POA: Diagnosis not present

## 2022-12-03 DIAGNOSIS — E7800 Pure hypercholesterolemia, unspecified: Secondary | ICD-10-CM | POA: Diagnosis not present

## 2022-12-03 DIAGNOSIS — Z8673 Personal history of transient ischemic attack (TIA), and cerebral infarction without residual deficits: Secondary | ICD-10-CM | POA: Diagnosis not present

## 2022-12-03 DIAGNOSIS — E1122 Type 2 diabetes mellitus with diabetic chronic kidney disease: Secondary | ICD-10-CM | POA: Diagnosis not present

## 2022-12-09 DIAGNOSIS — K2100 Gastro-esophageal reflux disease with esophagitis, without bleeding: Secondary | ICD-10-CM | POA: Diagnosis not present

## 2022-12-09 DIAGNOSIS — R1310 Dysphagia, unspecified: Secondary | ICD-10-CM | POA: Diagnosis not present

## 2022-12-09 DIAGNOSIS — K222 Esophageal obstruction: Secondary | ICD-10-CM | POA: Diagnosis not present

## 2023-06-03 DIAGNOSIS — Z8673 Personal history of transient ischemic attack (TIA), and cerebral infarction without residual deficits: Secondary | ICD-10-CM | POA: Diagnosis not present

## 2023-06-03 DIAGNOSIS — E1122 Type 2 diabetes mellitus with diabetic chronic kidney disease: Secondary | ICD-10-CM | POA: Diagnosis not present

## 2023-06-03 DIAGNOSIS — I10 Essential (primary) hypertension: Secondary | ICD-10-CM | POA: Diagnosis not present

## 2023-06-03 DIAGNOSIS — E7800 Pure hypercholesterolemia, unspecified: Secondary | ICD-10-CM | POA: Diagnosis not present

## 2023-06-10 DIAGNOSIS — E1122 Type 2 diabetes mellitus with diabetic chronic kidney disease: Secondary | ICD-10-CM | POA: Diagnosis not present

## 2023-06-10 DIAGNOSIS — E7800 Pure hypercholesterolemia, unspecified: Secondary | ICD-10-CM | POA: Diagnosis not present

## 2023-06-10 DIAGNOSIS — Z85528 Personal history of other malignant neoplasm of kidney: Secondary | ICD-10-CM | POA: Diagnosis not present

## 2023-06-10 DIAGNOSIS — Z79899 Other long term (current) drug therapy: Secondary | ICD-10-CM | POA: Diagnosis not present

## 2023-06-10 DIAGNOSIS — I10 Essential (primary) hypertension: Secondary | ICD-10-CM | POA: Diagnosis not present

## 2023-06-10 DIAGNOSIS — N1831 Chronic kidney disease, stage 3a: Secondary | ICD-10-CM | POA: Diagnosis not present

## 2023-07-09 DIAGNOSIS — H43813 Vitreous degeneration, bilateral: Secondary | ICD-10-CM | POA: Diagnosis not present

## 2023-07-09 DIAGNOSIS — E119 Type 2 diabetes mellitus without complications: Secondary | ICD-10-CM | POA: Diagnosis not present

## 2023-07-09 DIAGNOSIS — H04123 Dry eye syndrome of bilateral lacrimal glands: Secondary | ICD-10-CM | POA: Diagnosis not present

## 2023-07-09 DIAGNOSIS — D3131 Benign neoplasm of right choroid: Secondary | ICD-10-CM | POA: Diagnosis not present

## 2023-07-09 DIAGNOSIS — H2513 Age-related nuclear cataract, bilateral: Secondary | ICD-10-CM | POA: Diagnosis not present

## 2023-08-06 DIAGNOSIS — I10 Essential (primary) hypertension: Secondary | ICD-10-CM | POA: Diagnosis not present

## 2023-08-11 DIAGNOSIS — G2581 Restless legs syndrome: Secondary | ICD-10-CM | POA: Diagnosis not present

## 2023-08-11 DIAGNOSIS — E349 Endocrine disorder, unspecified: Secondary | ICD-10-CM | POA: Diagnosis not present

## 2023-08-11 DIAGNOSIS — Z0000 Encounter for general adult medical examination without abnormal findings: Secondary | ICD-10-CM | POA: Diagnosis not present

## 2023-08-11 DIAGNOSIS — L570 Actinic keratosis: Secondary | ICD-10-CM | POA: Diagnosis not present

## 2023-08-11 DIAGNOSIS — I700 Atherosclerosis of aorta: Secondary | ICD-10-CM | POA: Diagnosis not present

## 2023-08-11 DIAGNOSIS — I10 Essential (primary) hypertension: Secondary | ICD-10-CM | POA: Diagnosis not present

## 2023-08-11 DIAGNOSIS — R5383 Other fatigue: Secondary | ICD-10-CM | POA: Diagnosis not present

## 2023-08-11 DIAGNOSIS — I2510 Atherosclerotic heart disease of native coronary artery without angina pectoris: Secondary | ICD-10-CM | POA: Diagnosis not present

## 2023-08-11 DIAGNOSIS — T466X5A Adverse effect of antihyperlipidemic and antiarteriosclerotic drugs, initial encounter: Secondary | ICD-10-CM | POA: Diagnosis not present

## 2023-08-11 DIAGNOSIS — N1831 Chronic kidney disease, stage 3a: Secondary | ICD-10-CM | POA: Diagnosis not present

## 2023-08-11 DIAGNOSIS — G720 Drug-induced myopathy: Secondary | ICD-10-CM | POA: Diagnosis not present

## 2023-08-11 DIAGNOSIS — E1122 Type 2 diabetes mellitus with diabetic chronic kidney disease: Secondary | ICD-10-CM | POA: Diagnosis not present

## 2023-08-27 DIAGNOSIS — E349 Endocrine disorder, unspecified: Secondary | ICD-10-CM | POA: Diagnosis not present

## 2023-08-27 DIAGNOSIS — N1831 Chronic kidney disease, stage 3a: Secondary | ICD-10-CM | POA: Diagnosis not present

## 2023-08-27 DIAGNOSIS — R5383 Other fatigue: Secondary | ICD-10-CM | POA: Diagnosis not present

## 2023-08-27 DIAGNOSIS — Z79899 Other long term (current) drug therapy: Secondary | ICD-10-CM | POA: Diagnosis not present

## 2023-11-10 DIAGNOSIS — M791 Myalgia, unspecified site: Secondary | ICD-10-CM | POA: Diagnosis not present

## 2023-11-10 DIAGNOSIS — N1831 Chronic kidney disease, stage 3a: Secondary | ICD-10-CM | POA: Diagnosis not present

## 2023-11-10 DIAGNOSIS — E7800 Pure hypercholesterolemia, unspecified: Secondary | ICD-10-CM | POA: Diagnosis not present

## 2023-11-10 DIAGNOSIS — Z23 Encounter for immunization: Secondary | ICD-10-CM | POA: Diagnosis not present

## 2023-11-10 DIAGNOSIS — E1122 Type 2 diabetes mellitus with diabetic chronic kidney disease: Secondary | ICD-10-CM | POA: Diagnosis not present

## 2023-11-10 DIAGNOSIS — I10 Essential (primary) hypertension: Secondary | ICD-10-CM | POA: Diagnosis not present

## 2023-11-10 DIAGNOSIS — T466X5A Adverse effect of antihyperlipidemic and antiarteriosclerotic drugs, initial encounter: Secondary | ICD-10-CM | POA: Diagnosis not present

## 2023-11-10 DIAGNOSIS — I2510 Atherosclerotic heart disease of native coronary artery without angina pectoris: Secondary | ICD-10-CM | POA: Diagnosis not present
